# Patient Record
Sex: Female | Born: 1990 | Race: Black or African American | Hispanic: No | Marital: Single | Smoking: Never smoker
Health system: Southern US, Community
[De-identification: ages and names within clinical notes are randomized; demographics above are authoritative.]

## PROBLEM LIST (undated history)

## (undated) ENCOUNTER — Inpatient Hospital Stay (HOSPITAL_COMMUNITY): Payer: Self-pay

## (undated) DIAGNOSIS — Z9289 Personal history of other medical treatment: Secondary | ICD-10-CM

## (undated) DIAGNOSIS — F32A Depression, unspecified: Secondary | ICD-10-CM

## (undated) DIAGNOSIS — K602 Anal fissure, unspecified: Secondary | ICD-10-CM

## (undated) DIAGNOSIS — R87629 Unspecified abnormal cytological findings in specimens from vagina: Secondary | ICD-10-CM

## (undated) DIAGNOSIS — F419 Anxiety disorder, unspecified: Secondary | ICD-10-CM

## (undated) DIAGNOSIS — T7840XA Allergy, unspecified, initial encounter: Secondary | ICD-10-CM

## (undated) DIAGNOSIS — Z8619 Personal history of other infectious and parasitic diseases: Secondary | ICD-10-CM

## (undated) HISTORY — DX: Personal history of other medical treatment: Z92.89

## (undated) HISTORY — PX: MOUTH SURGERY: SHX715

## (undated) HISTORY — DX: Personal history of other infectious and parasitic diseases: Z86.19

## (undated) HISTORY — DX: Anal fissure, unspecified: K60.2

## (undated) HISTORY — PX: BILATERAL SALPINGECTOMY: SHX5743

## (undated) HISTORY — DX: Depression, unspecified: F32.A

## (undated) HISTORY — DX: Anxiety disorder, unspecified: F41.9

## (undated) HISTORY — PX: COSMETIC SURGERY: SHX468

## (undated) HISTORY — DX: Allergy, unspecified, initial encounter: T78.40XA

---
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
## 2010-02-08 ENCOUNTER — Inpatient Hospital Stay (HOSPITAL_COMMUNITY): Admission: AD | Admit: 2010-02-08 | Discharge: 2010-02-08 | Payer: Self-pay | Admitting: Obstetrics and Gynecology

## 2010-04-06 ENCOUNTER — Inpatient Hospital Stay (HOSPITAL_COMMUNITY): Admission: AD | Admit: 2010-04-06 | Discharge: 2010-04-09 | Payer: Self-pay | Admitting: Obstetrics and Gynecology

## 2010-04-07 ENCOUNTER — Encounter (INDEPENDENT_AMBULATORY_CARE_PROVIDER_SITE_OTHER): Payer: Self-pay | Admitting: Obstetrics and Gynecology

## 2010-09-08 ENCOUNTER — Emergency Department (HOSPITAL_COMMUNITY)
Admission: EM | Admit: 2010-09-08 | Discharge: 2010-09-08 | Payer: Self-pay | Source: Home / Self Care | Admitting: Emergency Medicine

## 2010-09-18 ENCOUNTER — Encounter (INDEPENDENT_AMBULATORY_CARE_PROVIDER_SITE_OTHER): Payer: Self-pay | Admitting: *Deleted

## 2010-09-25 NOTE — Letter (Signed)
Summary: New Patient letter  [facility]  [address]   [place], [place] [address]   Phone: [phone]  Fax: [phone]       09/18/2010 MRN: [id number]  [facility] [address] [place], [place]  [address]  Dear Ms. Osoria,  Welcome to the Gastroenterology Division at [facility].    You are scheduled to see Dr.  Arlyce Dice on 10/31/2010 at 2:15 on the 3rd floor at [facility], [address]  We ask that you try to arrive at our office 15 minutes prior to your appointment time to allow for check-in.  We would like you to complete the enclosed self-administered evaluation form prior to your visit and bring it with you on the day of your appointment.  We will review it with you.  Also, please bring a complete list of all your medications or, if you prefer, bring the medication bottles and we will list them.  Please bring your insurance card so that we may make a copy of it.  If your insurance requires a referral to see a specialist, please bring your referral form from your primary care physician.  Co-payments are due at the time of your visit and may be paid by cash, check or credit card.     Your office visit will consist of a consult with your physician (includes a physical exam), any laboratory testing he/she may order, scheduling of any necessary diagnostic testing (e.g. x-ray, ultrasound, CT-scan), and scheduling of a procedure (e.g. Endoscopy, Colonoscopy) if required.  Please allow enough time on your schedule to allow for any/all of these possibilities.    If you cannot keep your appointment, please call [phone] to cancel or reschedule prior to your appointment date.  This allows [place] the opportunity to schedule an appointment for another patient in need of care.  If you do not cancel or reschedule by 5 p.m. the business day prior to your appointment date, you will be charged a $50.00 late cancellation/no-show fee.    Thank you for choosing  [facility] for your medical needs.  We appreciate the opportunity to care for you.  Please visit [place] at our website  to learn more about our practice.                     Sincerely,                                                             The Gastroenterology Division

## 2010-10-24 LAB — CBC
HCT: 33.4 % — ABNORMAL LOW (ref 36.0–46.0)
HCT: 36 % (ref 36.0–46.0)
Hemoglobin: 11.3 g/dL — ABNORMAL LOW (ref 12.0–15.0)
Hemoglobin: 12.3 g/dL (ref 12.0–15.0)
MCH: 31.6 pg (ref 26.0–34.0)
MCH: 31.8 pg (ref 26.0–34.0)
MCHC: 33.9 g/dL (ref 30.0–36.0)
MCHC: 34.2 g/dL (ref 30.0–36.0)
MCV: 92.5 fL (ref 78.0–100.0)
MCV: 93.7 fL (ref 78.0–100.0)
Platelets: 171 10*3/uL (ref 150–400)
Platelets: 207 10*3/uL (ref 150–400)
RBC: 3.56 MIL/uL — ABNORMAL LOW (ref 3.87–5.11)
RBC: 3.89 MIL/uL (ref 3.87–5.11)
RDW: 13.2 % (ref 11.5–15.5)
RDW: 13.3 % (ref 11.5–15.5)
WBC: 8.4 10*3/uL (ref 4.0–10.5)
WBC: 9.4 10*3/uL (ref 4.0–10.5)

## 2010-10-24 LAB — RPR: RPR Ser Ql: NONREACTIVE

## 2010-10-26 LAB — CBC
MCH: 31.3 pg (ref 26.0–34.0)
MCHC: 34 g/dL (ref 30.0–36.0)
Platelets: 209 10*3/uL (ref 150–400)
RBC: 3.47 MIL/uL — ABNORMAL LOW (ref 3.87–5.11)

## 2010-10-26 LAB — COMPREHENSIVE METABOLIC PANEL
ALT: 11 U/L (ref 0–35)
AST: 16 U/L (ref 0–37)
Albumin: 2.8 g/dL — ABNORMAL LOW (ref 3.5–5.2)
Calcium: 9 mg/dL (ref 8.4–10.5)
Creatinine, Ser: 0.43 mg/dL (ref 0.4–1.2)
GFR calc Af Amer: >60 mL/min (ref >60)
Sodium: 135 mEq/L (ref 135–145)

## 2010-10-26 LAB — URINALYSIS, ROUTINE W REFLEX MICROSCOPIC
Bilirubin Urine: NEGATIVE
Glucose, UA: NEGATIVE mg/dL
Hgb urine dipstick: NEGATIVE
Specific Gravity, Urine: 1.02 (ref 1.005–1.030)
pH: 6 (ref 5.0–8.0)

## 2010-10-26 LAB — URINE MICROSCOPIC-ADD ON

## 2010-10-31 ENCOUNTER — Ambulatory Visit (INDEPENDENT_AMBULATORY_CARE_PROVIDER_SITE_OTHER): Payer: BC Managed Care – PPO | Admitting: Gastroenterology

## 2010-10-31 ENCOUNTER — Encounter: Payer: Self-pay | Admitting: Gastroenterology

## 2010-10-31 VITALS — BP 112/64 | HR 88 | Ht 66.0 in

## 2010-10-31 DIAGNOSIS — K602 Anal fissure, unspecified: Secondary | ICD-10-CM | POA: Insufficient documentation

## 2010-10-31 MED ORDER — HYDROCORTISONE ACETATE 25 MG RE SUPP: 25.0000 mg | Freq: Every day | RECTAL | Status: AC

## 2010-10-31 NOTE — Patient Instructions (Signed)
Please schedule a 6 week follow up  Anal Fissure An anal fissure often begins with sharp pain. This is usually following a bowel movement. It often causes bright red blood stained stools. It is the most common cause of rectal bleeding. One common cause of this is passage of a large, hard stool. It can also be caused by having frequent diarrheal stools. Anal fissures that occur for a longtime (chronic) may require surgery. CAUSES  Passing large, hard stools.   Frequent diarrheal stools.   Constipation.  SYMPTOMS  Bright red, blood stained stools.   Rectal bleeding.  HOME CARE INSTRUCTIONS  If constipation is the cause of the rectal fissure, it may be necessary to add a bulk-forming laxative. A diet high in fruits, whole grains, and vegetables will also help.   Taking hot sitz baths for 1 half hour 4 times per day may help.   Increase your fluid intake.   Only take over-the-counter or prescription medicines for pain, discomfort, or fever as directed by your caregiver. Do not take aspirin as this may increase the tendency for bleeding.   Do not use ointments containing anesthetic medications or hydrocortisone. They could slow healing.   Avoid constipating foods such as bananas and cheese.   In children, brown Karo syrup may be used by adding 1 teaspoon of syrup to 8 ounces of formula. An alternative is to give 3 teaspoons of mineral oil every day.  SEEK MEDICAL CARE IF: Rectal bleeding continues, changes in intensity, or becomes more severe. MAKE SURE YOU:   Understand these instructions.   Will watch your condition.   Will get help right away if you are not doing well or get worse.  Document Released: 07/27/2005 Document Re-Released: 10/21/2009 [facility] Patient Information 2011 [place], [place].  Hemorrhoids Hemorrhoids are dilated (enlarged) veins around the rectum. Sometimes clots will form in the veins. This makes them swollen and painful. These are called thrombosed  hemorrhoids. Causes of hemorrhoids include:  Pregnancy: this increases the pressure in the hemorrhoidal veins.   Constipation.   Straining to have a bowel movement.  HOME CARE INSTRUCTIONS  Eat a well balanced diet and drink 6 to 8 glasses of water every day to avoid constipation. You may also use a bulk laxative.   Avoid straining to have bowel movements.   Keep anal area dry and clean.   Only take over-the-counter or prescription medicines for pain, discomfort, or fever as directed by your caregiver.  If thrombosed:  Take hot sitz baths for 20 to 30 minutes, 3 to 4 times per day.   If the hemorrhoids are very tender and swollen, place ice packs on area as tolerated. Using ice packs between sitz baths may be helpful. Fill a plastic bag with ice and use a towel between the bag of ice and your skin.   Special creams and suppositories (Anusol, Nupercainal, Wyanoids) may be used or applied as directed.   Do not use a donut shaped pillow or sit on the toilet for long periods. This increases blood pooling and pain.   Move your bowels when your body has the urge; this will require less straining and will decrease pain and pressure.   Only take over-the-counter or prescription medicines for pain, discomfort, or fever as directed by your caregiver.  SEEK MEDICAL CARE IF:  You have increasing pain and swelling that is not controlled with your prescription.   You have uncontrolled bleeding.   You have an inability or difficulty having a bowel  movement.   You have pain or inflammation outside the area of the hemorrhoids.  MAKE SURE YOU:   Understand these instructions.   Will watch your condition.   Will get help right away if you are not doing well or get worse.  Document Released: 07/24/2000 Document Re-Released: 07/09/2008 [facility] Patient Information 2011 [place], [place].

## 2010-10-31 NOTE — Assessment & Plan Note (Signed)
The patient has a anal fissure causing symptoms of pain.  Recommendations #1 stool softeners. #2 warm soaks. #3 Anusol HC suppositories.  I instructed the patient to contact me in 2-3 weeks. If symptoms are not improved at which point I would consider topical therapy

## 2010-10-31 NOTE — Progress Notes (Signed)
History of Present Illness:  Leslie Ayers  Is a pleasant, 20 year old Afro-American female, referred at the request of Dr. Alfonse Ras  For evaluation of rectal pain. In December, 2011 she developed constipation with straining. At one point , with straining, she felt a tearing sensation of per rectum. Since that time she's having rectal pain with bowel movements. She is without bleeding. Bowels are now moving more regularly, but pain persists. She was seen at an urgent care and told to have an anal fissure.    Review of Systems: Pertinent positive and negative review of systems were noted in the above HPI section. All other review of systems were otherwise negative.    Current Medications, Allergies, Past Medical History, Past Surgical History, Family History and Social History were reviewed in [employer] [facility] electronic medical record  Vital signs were reviewed in today's medical record. Physical Exam: General: Well developed , well nourished, no acute distress Head: Normocephalic and atraumatic Eyes:  sclerae anicteric, EOMI Ears: Normal auditory acuity Mouth: No deformity or lesions Lungs: Clear throughout to auscultation Heart: Regular rate and rhythm; no murmurs, rubs or bruits Abdomen: Soft, non tender and non distended. No masses, hepatosplenomegaly or hernias noted. Normal Bowel sounds Rectal:   Inspection of the rectum demonstrated a fissure at the 7:00 position Musculoskeletal: Symmetrical with no gross deformities  Pulses:  Normal pulses noted Extremities: No clubbing, cyanosis, edema or deformities noted Neurological: Alert oriented x 4, grossly nonfocal Psychological:  Alert and cooperative. Normal mood and affect

## 2010-12-23 NOTE — Op Note (Signed)
Leslie Ayers, Leslie Ayers                ACCOUNT NO.:  [id number]   MEDICAL RECORD NO.:  [id number]          PATIENT TYPE:  AMB   LOCATION:  SDC                           FACILITY:  [facility]   PHYSICIAN:  Maxie Better, M.D.DATE OF BIRTH:  1990-10-19   DATE OF PROCEDURE:  11/30/2007  DATE OF DISCHARGE:                               OPERATIVE REPORT   PREOPERATIVE DIAGNOSIS:  Elective termination, intrauterine gestation  about 8 plus weeks.   PROCEDURE:  Ultrasound guided suction dilation and evacuation.   POSTOPERATIVE DIAGNOSIS:  Elective termination.   ANESTHESIA:  MAC and paracervical block.   SURGEON:  Maxie Better, M.D.   INDICATIONS:  20 year old gravida 1, para 0 female at about [redacted] weeks  gestation by LMP presents for elective termination.  Surgical risks were  reviewed with the patient and her mother.  Consent was signed and the  patient was transferred to the operating room.   PROCEDURE IN DETAIL:  Under adequate monitored anesthesia, the patient  was placed in the dorsal lithotomy position.  She was sterilely prepped  and draped in the usual fashion.  The bladder was catheterized for a  large amount of urine.  Examination under anesthesia revealed an 8-9  weeks size anteverted uterus.  No adnexal masses could be appreciated.  A bivalve speculum was placed in the vagina.  A single tooth tenaculum  was placed on the anterior lip of the cervix.  20 mL of 1% Nesacaine was  injected paracervically at 3 and 9 o'clock, the cervix serially dilated  up to #31 Pratt dilator and #7 mm curved suction cannula was then  introduced into the uterine cavity. Under ultrasound guidance, the  uterine cavity was evacuated of products of conception.  The cavity was  subsequently curetted.  A thin endometrial stripe was then noted by  ultrasound and the procedure was felt to be complete at which time all  instruments were then removed from the vagina.  The specimen labeled  products of  conception was sent to pathology.  Estimated blood loss was  minimal.  Complications none.  The patient tolerated the procedure well  and was transferred to the recovery room in stable condition.  Her blood  type is AB positive.      Maxie Better, M.D.  Electronically Signed     [place]/MEDQ  D:  11/30/2007  T:  11/30/2007  Job:  [id number]

## 2011-04-28 ENCOUNTER — Telehealth: Payer: Self-pay | Admitting: Gastroenterology

## 2011-04-28 NOTE — Telephone Encounter (Signed)
Pt states she is still having problems going to the bathroom. States she only goes about 1/week and they are very large stools. She is also still having problems with the fissure. Requesting to be seen. Pt scheduled to see Dr. Arlyce Dice 04/29/11@3 :45pm. Pt aware of appt date and time.

## 2011-04-29 ENCOUNTER — Ambulatory Visit (INDEPENDENT_AMBULATORY_CARE_PROVIDER_SITE_OTHER): Payer: BC Managed Care – PPO | Admitting: Gastroenterology

## 2011-04-29 ENCOUNTER — Encounter: Payer: Self-pay | Admitting: Gastroenterology

## 2011-04-29 DIAGNOSIS — K5900 Constipation, unspecified: Secondary | ICD-10-CM | POA: Insufficient documentation

## 2011-04-29 DIAGNOSIS — K602 Anal fissure, unspecified: Secondary | ICD-10-CM

## 2011-04-29 NOTE — Progress Notes (Signed)
Leslie Ayers has returned for evaluation of constipation and rectal pain. She moves her bowels only once a week. She has severe straining and inability to evacuate her stools. Despite taking suppositories she continues to have rectal discomfort and minimal amounts of bleeding.

## 2011-04-29 NOTE — Assessment & Plan Note (Addendum)
She continues symptomatic but I doubt any therapy will be successful as long as she is having such severe constipation. I will treat  her fissure once her constipation is under control. In the interim she'll continue warm soaks and suppositories

## 2011-04-29 NOTE — Assessment & Plan Note (Addendum)
Patient likely has idiopathic constipation. She'll consider enrollment in a constipation trial. Failing this, I will place  her on MiraLax and fiber supplements

## 2011-04-29 NOTE — Patient Instructions (Signed)
Research will speak with you today Call back as needed

## 2011-05-05 LAB — CBC
MCHC: 34.2
RBC: 4.26
WBC: 8.1

## 2011-05-05 LAB — ABO/RH: ABO/RH(D): AB POS

## 2014-01-12 ENCOUNTER — Inpatient Hospital Stay (HOSPITAL_COMMUNITY)
Admission: AD | Admit: 2014-01-12 | Discharge: 2014-01-12 | Disposition: A | Discharge status: Home / Self Care | Payer: BC Managed Care – PPO | Source: Ambulatory Visit | Attending: Obstetrics and Gynecology | Admitting: Obstetrics and Gynecology

## 2014-01-12 ENCOUNTER — Other Ambulatory Visit (HOSPITAL_COMMUNITY): Payer: Self-pay | Admitting: *Deleted

## 2014-01-12 ENCOUNTER — Inpatient Hospital Stay (HOSPITAL_COMMUNITY): Payer: BC Managed Care – PPO

## 2014-01-12 ENCOUNTER — Encounter (HOSPITAL_COMMUNITY): Payer: Self-pay | Admitting: *Deleted

## 2014-01-12 DIAGNOSIS — Z833 Family history of diabetes mellitus: Secondary | ICD-10-CM | POA: Insufficient documentation

## 2014-01-12 DIAGNOSIS — R109 Unspecified abdominal pain: Secondary | ICD-10-CM | POA: Insufficient documentation

## 2014-01-12 DIAGNOSIS — O99891 Other specified diseases and conditions complicating pregnancy: Secondary | ICD-10-CM | POA: Insufficient documentation

## 2014-01-12 DIAGNOSIS — O26899 Other specified pregnancy related conditions, unspecified trimester: Secondary | ICD-10-CM

## 2014-01-12 DIAGNOSIS — O9989 Other specified diseases and conditions complicating pregnancy, childbirth and the puerperium: Principal | ICD-10-CM

## 2014-01-12 LAB — URINALYSIS, ROUTINE W REFLEX MICROSCOPIC
BILIRUBIN URINE: NEGATIVE
GLUCOSE, UA: NEGATIVE mg/dL
HGB URINE DIPSTICK: NEGATIVE
Ketones, ur: 15 mg/dL — AB
Nitrite: NEGATIVE
PROTEIN: NEGATIVE mg/dL
Specific Gravity, Urine: 1.015 (ref 1.005–1.030)
UROBILINOGEN UA: 0.2 mg/dL (ref 0.0–1.0)
pH: 6 (ref 5.0–8.0)

## 2014-01-12 LAB — WET PREP, GENITAL
Trich, Wet Prep: NONE SEEN
YEAST WET PREP: NONE SEEN

## 2014-01-12 LAB — CBC WITH DIFFERENTIAL/PLATELET
BASOS ABS: 0.1 10*3/uL (ref 0.0–0.1)
BASOS PCT: 1 % (ref 0–1)
EOS ABS: 0.1 10*3/uL (ref 0.0–0.7)
EOS PCT: 1 % (ref 0–5)
HEMATOCRIT: 41.9 % (ref 36.0–46.0)
HEMOGLOBIN: 14 g/dL (ref 12.0–15.0)
Lymphocytes Relative: 37 % (ref 12–46)
Lymphs Abs: 2.3 10*3/uL (ref 0.7–4.0)
MCH: 29.7 pg (ref 26.0–34.0)
MCHC: 33.4 g/dL (ref 30.0–36.0)
MCV: 88.8 fL (ref 78.0–100.0)
MONO ABS: 0.4 10*3/uL (ref 0.1–1.0)
MONOS PCT: 6 % (ref 3–12)
NEUTROS ABS: 3.5 10*3/uL (ref 1.7–7.7)
Neutrophils Relative %: 55 % (ref 43–77)
Platelets: 225 10*3/uL (ref 150–400)
RBC: 4.72 MIL/uL (ref 3.87–5.11)
RDW: 12 % (ref 11.5–15.5)
WBC: 6.3 10*3/uL (ref 4.0–10.5)

## 2014-01-12 LAB — HCG, QUANTITATIVE, PREGNANCY: hCG, Beta Chain, Quant, S: 280 m[IU]/mL — ABNORMAL HIGH (ref <5)

## 2014-01-12 LAB — URINE MICROSCOPIC-ADD ON

## 2014-01-12 LAB — POCT PREGNANCY, URINE: PREG TEST UR: POSITIVE — AB

## 2014-01-12 NOTE — MAU Note (Signed)
Patient states she has had a positive home pregnancy test. Has had low back and low abdominal pain off and on for one week. Denies bleeding, nausea or vomiting but has a slight vaginal discharge.

## 2014-01-12 NOTE — MAU Provider Note (Signed)
CSN: [id number]     Arrival date & time 01/12/14  1454 History   None    Chief Complaint  Patient presents with  . Possible Pregnancy  . Back Pain  . Abdominal Pain     (Consider location/radiation/quality/duration/timing/severity/associated sxs/prior Treatment) Patient is a 23 y.o. female presenting with cramps. The history is provided by the patient.  Abdominal Cramping The primary symptoms of the illness include vaginal discharge. The primary symptoms of the illness do not include fever, nausea, vomiting, diarrhea, dysuria or vaginal bleeding. Abdominal pain: cramping. The onset of the illness was gradual.  The vaginal discharge was first noticed 2 days ago. Vaginal discharge is a new problem. The patient believes that the vaginal discharge is unchanged since it began. The amount of discharge is moderate. The color of the discharge is clear. The vaginal discharge is odorless. The vaginal discharge is not associated with dysuria.   The patient states that she believes she is currently pregnant. Symptoms associated with the illness do not include back pain.   Leslie Ayers is a 23 y.o. G2P1001 who presents to the MAU with abdominal cramping that started a few days ago. She describes the symptoms as feeling like she does when she is ready to start her period. The cramping goes across the lower abdomen. She had a positive HPT yesterday.    Past Medical History  Diagnosis Date  . Anal fissure   . Constipation   . Palpitation    Past Surgical History  Procedure Laterality Date  . 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 No past surgeries     Family History  Problem Relation Age of Onset  . Diabetes Father   . Irritable bowel syndrome Mother   . Colon cancer Neg Hx    History  Substance Use Topics  . Smoking status: Never Smoker   . Smokeless tobacco: Never Used  . Alcohol Use: No   OB History   Grav Para Term Preterm Abortions TAB SAB Ect Mult Living   2 1 1        1      Review of Systems  Constitutional: Negative for fever.  HENT: Negative.   Eyes: Negative for pain and redness.  Respiratory: Negative for cough and wheezing.   Cardiovascular: Negative for chest pain.  Gastrointestinal: Negative for nausea, vomiting and diarrhea. Abdominal pain: cramping.  Genitourinary: Positive for vaginal discharge. Negative for dysuria and vaginal bleeding.  Musculoskeletal: Negative for back pain.  Skin: Negative for rash.  Neurological: Negative for syncope and headaches.  Psychiatric/Behavioral: Negative for confusion. The patient is not nervous/anxious.       Allergies  Penicillins  Home Medications   Prior to Admission medications   Not on File   BP 122/74  Pulse 94  Temp(Src) 98.9 F (37.2 C) (Oral)  Resp 16  Ht 5' 5.5" (1.664 m)  Wt 175 lb (79.379 kg)  BMI 28.67 kg/m2  SpO2 100%  LMP 12/07/2013 Physical Exam  Nursing note and vitals reviewed. Constitutional: She is oriented to person, place, and time. She appears well-developed and well-nourished. No distress.  HENT:  Head: Normocephalic.  Eyes: Conjunctivae and EOM are normal.  Neck: Neck supple.  Cardiovascular: Normal rate.   Pulmonary/Chest: Effort normal.  Abdominal: Soft. Bowel sounds are normal. There is no tenderness.  Unable to reproduce the cramping pain the patient has had.  Genitourinary:  External genitalia without lesions. White discharge vaginal vault.  Cervix long, closed, no CMT, no adnexal tenderness. Uterus without palpable enlargement.   Musculoskeletal: Normal range of motion.  Neurological: She is alert and oriented to person, place, and time. No cranial nerve deficit.  Skin: Skin is warm and dry.  Psychiatric: She has a normal mood and affect. Her behavior is normal.    ED Course  Procedures Results for orders placed during the hospital encounter of 01/12/14 (from the past 24 hour(s))  URINALYSIS, ROUTINE W REFLEX MICROSCOPIC     Status: Abnormal    Collection Time    01/12/14  4:00 PM      Result Value Ref Range   Color, Urine YELLOW  YELLOW   APPearance CLEAR  CLEAR   Specific Gravity, Urine 1.015  1.005 - 1.030   pH 6.0  5.0 - 8.0   Glucose, UA NEGATIVE  NEGATIVE mg/dL   Hgb urine dipstick NEGATIVE  NEGATIVE   Bilirubin Urine NEGATIVE  NEGATIVE   Ketones, ur 15 (*) NEGATIVE mg/dL   Protein, ur NEGATIVE  NEGATIVE mg/dL   Urobilinogen, UA 0.2  0.0 - 1.0 mg/dL   Nitrite NEGATIVE  NEGATIVE   Leukocytes, UA MODERATE (*) NEGATIVE  URINE MICROSCOPIC-ADD ON     Status: Abnormal   Collection Time    01/12/14  4:00 PM      Result Value Ref Range   Squamous Epithelial / LPF FEW (*) RARE   WBC, UA 3-6  <3 WBC/hpf  POCT PREGNANCY, URINE     Status: Abnormal   Collection Time    01/12/14  4:26 PM      Result Value Ref Range   Preg Test, Ur POSITIVE (*) NEGATIVE  WET PREP, GENITAL     Status: Abnormal   Collection Time    01/12/14  5:33 PM      Result Value Ref Range   Yeast Wet Prep HPF POC NONE SEEN  NONE SEEN   Trich, Wet Prep NONE SEEN  NONE SEEN   Clue Cells Wet Prep HPF POC MODERATE (*) NONE SEEN   WBC, Wet Prep HPF POC MODERATE (*) NONE SEEN  CBC WITH DIFFERENTIAL     Status: None   Collection Time    01/12/14  5:45 PM      Result Value Ref Range   WBC 6.3  4.0 - 10.5 K/uL   RBC 4.72  3.87 - 5.11 MIL/uL   Hemoglobin 14.0  12.0 - 15.0 g/dL   HCT 16.141.9  09.636.0 - 04.546.0 %   MCV 88.8  78.0 - 100.0 fL   MCH 29.7  26.0 - 34.0 pg   MCHC 33.4  30.0 - 36.0 g/dL   RDW 40.912.0  81.111.5 - 91.415.5 %   Platelets 225  150 - 400 K/uL   Neutrophils Relative % 55  43 - 77 %   Neutro Abs 3.5  1.7 - 7.7 K/uL   Lymphocytes Relative 37  12 - 46 %   Lymphs Abs 2.3  0.7 - 4.0 K/uL   Monocytes Relative 6  3 - 12 %   Monocytes Absolute 0.4  0.1 - 1.0 K/uL   Eosinophils Relative 1  0 - 5 %   Eosinophils Absolute 0.1  0.0 - 0.7 K/uL   Basophils Relative 1  0 - 1 %   Basophils Absolute 0.1  0.0 - 0.1 K/uL  HCG, QUANTITATIVE, PREGNANCY     Status:  Abnormal   Collection Time    01/12/14  5:45 PM  Result Value Ref Range   hCG, Beta Chain, Quant, S 280 (*) <5 mIU/mL    US Ob Comp Less 14 Wks  01/12/2014   CLINICAL DATA:  Pelvic pain.  Early pregnancy.  EXAM: OBSTETRIC <14 WK [place] AND TRANSVAGINAL OB US  TECHNIQUE: Both transabdominal and transvaginal ultrasound examinations were performed for complete evaluation of the gestation as well as the maternal uterus, adnexal regions, and pelvic cul-de-sac. Transvaginal technique was performed to assess early pregnancy.  COMPARISON:  No priors.  FINDINGS: Intrauterine gestational sac: None.  Yolk sac:  None.  Embryo:  None.  Cardiac Activity: None.  Maternal uterus/adnexae: Left ovary is normal in appearance. Right ovary is also normal in appearance, with a dominant follicle incidentally noted. Trace volume of free fluid in the cul-de-sac, presumably physiologic in this young female patient.  IMPRESSION: 1. No IUP identified. 2. Trace volume of free fluid in the cul-de-sac, presumably physiologic in this young female patient.   Electronically Signed   By: Trudie Reed M.D.   On: 01/12/2014 19:01   US Ob Transvaginal  01/12/2014   CLINICAL DATA:  Pelvic pain.  Early pregnancy.  EXAM: OBSTETRIC <14 WK [place] AND TRANSVAGINAL OB US  TECHNIQUE: Both transabdominal and transvaginal ultrasound examinations were performed for complete evaluation of the gestation as well as the maternal uterus, adnexal regions, and pelvic cul-de-sac. Transvaginal technique was performed to assess early pregnancy.  COMPARISON:  No priors.  FINDINGS: Intrauterine gestational sac: None.  Yolk sac:  None.  Embryo:  None.  Cardiac Activity: None.  Maternal uterus/adnexae: Left ovary is normal in appearance. Right ovary is also normal in appearance, with a dominant follicle incidentally noted. Trace volume of free fluid in the cul-de-sac, presumably physiologic in this young female patient.  IMPRESSION: 1. No IUP identified. 2. Trace volume  of free fluid in the cul-de-sac, presumably physiologic in this young female patient.   Electronically Signed   By: Trudie Reed M.D.   On: 01/12/2014 19:01   MDM  23 y.o. female with lower abdominal cramping and positive HPT. Stable for discharge without pain or bleeding at this time. Discussed with the patient need for follow up Bhcg in 2 days. Ectopic precautions. I have reviewed this patient's vital signs, nurses notes, appropriate labs and imaging.  I have discussed findings with the patient and plan of care and she voices understanding and agrees to plan.

## 2014-01-12 NOTE — Discharge Instructions (Signed)
Your pregnancy hormone level today is to low to be able to see a pregnancy on ultrasound. We will need for you to return in 2 days to repeat the blood work to see if your hormone level is rising at a normal rate. Return sooner for ay problems. No sex, no tampons, nothing in the vagina until follow up.   Abdominal Pain During Pregnancy Belly (abdominal) pain is common during pregnancy. Most of the time, it is not a serious problem. Other times, it can be a sign that something is wrong with the pregnancy. Always tell your doctor if you have belly pain. HOME CARE Monitor your belly pain for any changes. The following actions may help you feel better:  Do not have sex (intercourse) or put anything in your vagina until you feel better.  Rest until your pain stops.  Drink clear fluids if you feel sick to your stomach (nauseous). Do not eat solid food until you feel better.  Only take medicine as told by your doctor.  Keep all doctor visits as told. GET HELP RIGHT AWAY IF:   You are bleeding, leaking fluid, or pieces of tissue come out of your vagina.  You have more pain or cramping.  You keep throwing up (vomiting).  You have pain when you pee (urinate) or have blood in your pee.  You have a fever.  You do not feel your baby moving as much.  You feel very weak or feel like passing out.  You have trouble breathing, with or without belly pain.  You have a very bad headache and belly pain.  You have fluid leaking from your vagina and belly pain.  You keep having watery poop (diarrhea).  Your belly pain does not go away after resting, or the pain gets worse. MAKE SURE YOU:   Understand these instructions.  Will watch your condition.  Will get help right away if you are not doing well or get worse. Document Released: 07/15/2009 Document Revised: 03/29/2013 Document Reviewed: 02/23/2013 [facility] Patient Information 2014 [place], [place].

## 2014-01-13 LAB — GC/CHLAMYDIA PROBE AMP
CT PROBE, AMP APTIMA: POSITIVE — AB
GC PROBE AMP APTIMA: NEGATIVE

## 2014-01-13 NOTE — MAU Provider Note (Signed)
Attestation of Attending Supervision of Advanced Practitioner: Evaluation and management procedures were performed by the PA/NP/CNM/OB Fellow under my supervision/collaboration. Chart reviewed and agree with management and plan.  Christin Bach V 01/13/2014 2:40 PM

## 2014-01-14 ENCOUNTER — Inpatient Hospital Stay (HOSPITAL_COMMUNITY)
Admission: AD | Admit: 2014-01-14 | Discharge: 2014-01-14 | Disposition: A | Discharge status: Home / Self Care | Payer: BC Managed Care – PPO | Source: Ambulatory Visit | Attending: Obstetrics | Admitting: Obstetrics

## 2014-01-14 DIAGNOSIS — R109 Unspecified abdominal pain: Secondary | ICD-10-CM | POA: Insufficient documentation

## 2014-01-14 DIAGNOSIS — Z3490 Encounter for supervision of normal pregnancy, unspecified, unspecified trimester: Secondary | ICD-10-CM

## 2014-01-14 DIAGNOSIS — A749 Chlamydial infection, unspecified: Secondary | ICD-10-CM

## 2014-01-14 DIAGNOSIS — N760 Acute vaginitis: Secondary | ICD-10-CM | POA: Insufficient documentation

## 2014-01-14 DIAGNOSIS — A5619 Other chlamydial genitourinary infection: Secondary | ICD-10-CM | POA: Insufficient documentation

## 2014-01-14 DIAGNOSIS — B9689 Other specified bacterial agents as the cause of diseases classified elsewhere: Secondary | ICD-10-CM

## 2014-01-14 DIAGNOSIS — O2390 Unspecified genitourinary tract infection in pregnancy, unspecified trimester: Secondary | ICD-10-CM | POA: Insufficient documentation

## 2014-01-14 DIAGNOSIS — N739 Female pelvic inflammatory disease, unspecified: Secondary | ICD-10-CM | POA: Insufficient documentation

## 2014-01-14 LAB — URINE CULTURE
Colony Count: NO GROWTH
Culture: NO GROWTH

## 2014-01-14 LAB — HCG, QUANTITATIVE, PREGNANCY: HCG, BETA CHAIN, QUANT, S: 540 m[IU]/mL — AB (ref <5)

## 2014-01-14 MED ORDER — PRENATAL VITAMINS PLUS 27-1 MG PO TABS: 1.0000 | ORAL_TABLET | Freq: Every day | ORAL | Status: DC

## 2014-01-14 MED ORDER — PROMETHAZINE HCL 12.5 MG PO TABS: 12.5000 mg | ORAL_TABLET | Freq: Four times a day (QID) | ORAL | Status: DC | PRN

## 2014-01-14 MED ORDER — AZITHROMYCIN 500 MG PO TABS: 1000.0000 mg | ORAL_TABLET | Freq: Once | ORAL | Status: AC

## 2014-01-14 MED ORDER — METRONIDAZOLE 500 MG PO TABS: 500.0000 mg | ORAL_TABLET | Freq: Two times a day (BID) | ORAL | Status: AC

## 2014-01-14 NOTE — Progress Notes (Addendum)
Established patient of [facility] - seen by [facility] on Friday June 5th and evaluated - scheduled for quant follow-up today. [facility] not contacted to evaluate patient on Friday or called for management plan.  Seen for vaginal discharge - abdominal pain with cramping with (+) UPT at home.  Here today for repeat quant per order of Faculty [facility] provider.   Per chart - quant 280 and sono done without any significant findings due to low gestational age. Urinalysis: 15 ketones spec gravity 1015 Wet prep: moderate CLUE and moderate WBC  (Not treated for BV at visit 6/5) Gonorrhea: negative Chlamydia: POSITIVE  (no presumptive treatment at time of service 6/5) urine culture - negative   TODAY - quant 540 with good rise.  No bleeding or spotting. Persistent cramping but no intense abdominal pain. Patient notified of positive BV and chlamydia - RX to her local pharmacy to complete as prescribed. Call [facility] tomorrow for apt in 1 week. Reminded to call [facility] PRIOR to hospital visit.  RX to pharmacy Promethazine 12.5 to take prior to Azithromycin doses today to prevent vomiting. Azithromycin 1000mg  single dose Metronidiazole 500 BID x 7 days Prenatal vitamin daily  NO sex until BOTH partners complete treatment and she has TOC USE condoms  Call if any bleeding or worsening abdominal pain.  Marlinda Mike CNM MSN [facility]

## 2014-01-14 NOTE — Discharge Instructions (Signed)
Bacterial Vaginosis Bacterial vaginosis is an infection of the vagina. It happens when too many of certain germs (bacteria) grow in the vagina. HOME CARE  Take your medicine as told by your doctor.  Finish your medicine even if you start to feel better.  Do not have sex until you finish your medicine and are better.  Tell your sex partner that you have an infection. They should see their doctor for treatment.  Practice safe sex. Use condoms. Have only one sex partner. GET HELP IF:  You are not getting better after 3 days of treatment.  You have more pain than before.  You have a fever. MAKE SURE YOU:   Understand these instructions.  Will watch your condition.  Will get help right away if you are not doing well or get worse.   Document Released: 05/05/2008 Document Revised: 05/17/2013 Document Reviewed: 03/08/2013 [facility] Patient Information 2014 [place], [place].  Chlamydia, Female Chlamydia is an infection. It is spread from one person to another person during sexual contact. This infection can be in the cervix, urine tube (urethra), throat, or bottom (rectum). This infection needs treatment. HOME CARE   Take your medicines (antibiotics) as told. Finish them even if you start to feel better.  Only take medicine as told by your doctor.  Tell your sex partner(s) that you have chlamydia. They must also be treated immediately.  Do not have sex until your doctor says it is okay.  Rest.  Eat healthy.   Drink enough fluids to keep your pee (urine) clear or pale yellow. GET HELP IF:  You have pain when you pee.  You have abdominal pain.  You have vaginal discharge. GET HELP RIGHT AWAY IF:   You have a fever that will not go away after 3 days.  You have a fever and your symptoms suddenly get worse.  You feel sick to your stomach (nauseous) or you throw up (vomit).  You have trouble swallowing. MAKE SURE YOU:   Understand these instructions.  Will watch  your condition.  Will get help right away if you are not doing well or get worse. Document Released: 05/05/2008 Document Revised: 05/17/2013 Document Reviewed: 04/03/2013 [facility] Patient Information 2014 [place], [place].  Pregnancy, First Trimester The first trimester is the first 3 months your baby is growing inside you. It is important to follow your doctor's instructions. HOME CARE   Do not smoke.  Do not drink alcohol.  Only take medicine as told by your doctor.  Exercise.  Eat healthy foods. Eat regular, well-balanced meals.  Eat crackers before getting up in the morning.  Eat 4 to 5 small meals rather than 3 large meals.  Drink liquids between meals, not during meals.  Go to all appointments as told.  Take all vitamins or supplements as told by your doctor. GET HELP RIGHT AWAY IF:   You develop a fever.  There is bleeding from the vagina.  You develop severe belly (abdominal) or back pain.  You throw up more than 4 times and cannot keep fluids down.  You have severe dizziness or pass out (faint).  You have a headache, watery poop (diarrhea), pain with peeing (urinating), or cannot breath right.   Document Released: 01/13/2008 Document Revised: 10/19/2011 Document Reviewed: 01/13/2008 [facility] Patient Information 2014 [place], [place].

## 2014-01-14 NOTE — MAU Note (Signed)
Pt presents to MAU for repeat quant.

## 2014-06-11 ENCOUNTER — Encounter (HOSPITAL_COMMUNITY): Payer: Self-pay | Admitting: *Deleted

## 2014-09-23 ENCOUNTER — Emergency Department (HOSPITAL_COMMUNITY): Payer: BLUE CROSS/BLUE SHIELD

## 2014-09-23 ENCOUNTER — Encounter (HOSPITAL_COMMUNITY): Payer: Self-pay | Admitting: Emergency Medicine

## 2014-09-23 ENCOUNTER — Emergency Department (HOSPITAL_COMMUNITY)
Admission: EM | Admit: 2014-09-23 | Discharge: 2014-09-23 | Disposition: A | Discharge status: Home / Self Care | Payer: BLUE CROSS/BLUE SHIELD | Attending: Emergency Medicine | Admitting: Emergency Medicine

## 2014-09-23 DIAGNOSIS — Z3202 Encounter for pregnancy test, result negative: Secondary | ICD-10-CM | POA: Insufficient documentation

## 2014-09-23 DIAGNOSIS — N8320 Unspecified ovarian cysts: Secondary | ICD-10-CM | POA: Diagnosis not present

## 2014-09-23 DIAGNOSIS — N83202 Unspecified ovarian cyst, left side: Secondary | ICD-10-CM

## 2014-09-23 DIAGNOSIS — R109 Unspecified abdominal pain: Secondary | ICD-10-CM | POA: Diagnosis present

## 2014-09-23 DIAGNOSIS — Z79899 Other long term (current) drug therapy: Secondary | ICD-10-CM | POA: Insufficient documentation

## 2014-09-23 DIAGNOSIS — Z880 Allergy status to penicillin: Secondary | ICD-10-CM | POA: Diagnosis not present

## 2014-09-23 DIAGNOSIS — Z8719 Personal history of other diseases of the digestive system: Secondary | ICD-10-CM | POA: Diagnosis not present

## 2014-09-23 LAB — URINALYSIS, ROUTINE W REFLEX MICROSCOPIC
BILIRUBIN URINE: NEGATIVE
GLUCOSE, UA: NEGATIVE mg/dL
Ketones, ur: 15 mg/dL — AB
NITRITE: NEGATIVE
Protein, ur: 30 mg/dL — AB
Specific Gravity, Urine: 1.026 (ref 1.005–1.030)
Urobilinogen, UA: 1 mg/dL (ref 0.0–1.0)
pH: 6.5 (ref 5.0–8.0)

## 2014-09-23 LAB — COMPREHENSIVE METABOLIC PANEL
ALK PHOS: 59 U/L (ref 39–117)
ALT: 9 U/L (ref 0–35)
AST: 25 U/L (ref 0–37)
Albumin: 4.1 g/dL (ref 3.5–5.2)
Anion gap: 6 (ref 5–15)
BUN: 6 mg/dL (ref 6–23)
CO2: 30 mmol/L (ref 19–32)
Calcium: 9.2 mg/dL (ref 8.4–10.5)
Chloride: 108 mmol/L (ref 96–112)
Creatinine, Ser: 0.72 mg/dL (ref 0.50–1.10)
GFR calc Af Amer: >90 mL/min (ref >90)
GFR calc non Af Amer: >90 mL/min (ref >90)
GLUCOSE: 119 mg/dL — AB (ref 70–99)
Potassium: 4.5 mmol/L (ref 3.5–5.1)
Sodium: 144 mmol/L (ref 135–145)
Total Bilirubin: 1 mg/dL (ref 0.3–1.2)
Total Protein: 7.5 g/dL (ref 6.0–8.3)

## 2014-09-23 LAB — URINE MICROSCOPIC-ADD ON

## 2014-09-23 LAB — CBC WITH DIFFERENTIAL/PLATELET
BASOS ABS: 0 10*3/uL (ref 0.0–0.1)
BASOS PCT: 1 % (ref 0–1)
EOS ABS: 0 10*3/uL (ref 0.0–0.7)
EOS PCT: 0 % (ref 0–5)
HEMATOCRIT: 40.6 % (ref 36.0–46.0)
Hemoglobin: 13.4 g/dL (ref 12.0–15.0)
LYMPHS ABS: 1.1 10*3/uL (ref 0.7–4.0)
Lymphocytes Relative: 30 % (ref 12–46)
MCH: 29.1 pg (ref 26.0–34.0)
MCHC: 33 g/dL (ref 30.0–36.0)
MCV: 88.3 fL (ref 78.0–100.0)
MONO ABS: 0.2 10*3/uL (ref 0.1–1.0)
MONOS PCT: 4 % (ref 3–12)
NEUTROS ABS: 2.4 10*3/uL (ref 1.7–7.7)
Neutrophils Relative %: 65 % (ref 43–77)
Platelets: 216 10*3/uL (ref 150–400)
RBC: 4.6 MIL/uL (ref 3.87–5.11)
RDW: 12.3 % (ref 11.5–15.5)
WBC: 3.7 10*3/uL — AB (ref 4.0–10.5)

## 2014-09-23 LAB — LIPASE, BLOOD: Lipase: 25 U/L (ref 11–59)

## 2014-09-23 LAB — PREGNANCY, URINE: PREG TEST UR: NEGATIVE

## 2014-09-23 MED ORDER — MORPHINE SULFATE 4 MG/ML IJ SOLN
4.0000 mg | Freq: Once | INTRAMUSCULAR | Status: AC
  Administered 2014-09-23: 4 mg via INTRAVENOUS
  Filled 2014-09-23: qty 1

## 2014-09-23 MED ORDER — ONDANSETRON HCL 4 MG/2ML IJ SOLN
4.0000 mg | Freq: Once | INTRAMUSCULAR | Status: AC
  Administered 2014-09-23: 4 mg via INTRAVENOUS
  Filled 2014-09-23: qty 2

## 2014-09-23 MED ORDER — ONDANSETRON 8 MG PO TBDP: 8.0000 mg | ORAL_TABLET | Freq: Three times a day (TID) | ORAL | Status: DC | PRN

## 2014-09-23 MED ORDER — OXYCODONE-ACETAMINOPHEN 5-325 MG PO TABS: 1.0000 | ORAL_TABLET | ORAL | Status: DC | PRN

## 2014-09-23 MED ORDER — IBUPROFEN 600 MG PO TABS: 600.0000 mg | ORAL_TABLET | Freq: Three times a day (TID) | ORAL | Status: DC | PRN

## 2014-09-23 NOTE — Discharge Instructions (Signed)
Ovarian Cyst An ovarian cyst is a fluid-filled sac that forms on an ovary. The ovaries are small organs that produce eggs in women. Various types of cysts can form on the ovaries. Most are not cancerous. Many do not cause problems, and they often go away on their own. Some may cause symptoms and require treatment. Common types of ovarian cysts include:  Functional cysts--These cysts may occur every month during the menstrual cycle. This is normal. The cysts usually go away with the next menstrual cycle if the woman does not get pregnant. Usually, there are no symptoms with a functional cyst.  Endometrioma cysts--These cysts form from the tissue that lines the uterus. They are also called "chocolate cysts" because they become filled with blood that turns brown. This type of cyst can cause pain in the lower abdomen during intercourse and with your menstrual period.  Cystadenoma cysts--This type develops from the cells on the outside of the ovary. These cysts can get very big and cause lower abdomen pain and pain with intercourse. This type of cyst can twist on itself, cut off its blood supply, and cause severe pain. It can also easily rupture and cause a lot of pain.  Dermoid cysts--This type of cyst is sometimes found in both ovaries. These cysts may contain different kinds of body tissue, such as skin, teeth, hair, or cartilage. They usually do not cause symptoms unless they get very big.  Theca lutein cysts--These cysts occur when too much of a certain hormone (human chorionic gonadotropin) is produced and overstimulates the ovaries to produce an egg. This is most common after procedures used to assist with the conception of a baby (in vitro fertilization). CAUSES   Fertility drugs can cause a condition in which multiple large cysts are formed on the ovaries. This is called ovarian hyperstimulation syndrome.  A condition called polycystic ovary syndrome can cause hormonal imbalances that can lead to  nonfunctional ovarian cysts. SIGNS AND SYMPTOMS  Many ovarian cysts do not cause symptoms. If symptoms are present, they may include:  Pelvic pain or pressure.  Pain in the lower abdomen.  Pain during sexual intercourse.  Increasing girth (swelling) of the abdomen.  Abnormal menstrual periods.  Increasing pain with menstrual periods.  Stopping having menstrual periods without being pregnant. DIAGNOSIS  These cysts are commonly found during a routine or annual pelvic exam. Tests may be ordered to find out more about the cyst. These tests may include:  Ultrasound.  X-ray of the pelvis.  CT scan.  MRI.  Blood tests. TREATMENT  Many ovarian cysts go away on their own without treatment. Your health care provider may want to check your cyst regularly for 2-3 months to see if it changes. For women in menopause, it is particularly important to monitor a cyst closely because of the higher rate of ovarian cancer in menopausal women. When treatment is needed, it may include any of the following:  A procedure to drain the cyst (aspiration). This may be done using a long needle and ultrasound. It can also be done through a laparoscopic procedure. This involves using a thin, lighted tube with a tiny camera on the end (laparoscope) inserted through a small incision.  Surgery to remove the whole cyst. This may be done using laparoscopic surgery or an open surgery involving a larger incision in the lower abdomen.  Hormone treatment or birth control pills. These methods are sometimes used to help dissolve a cyst. HOME CARE INSTRUCTIONS   Only take over-the-counter   or prescription medicines as directed by your health care provider.  Follow up with your health care provider as directed.  Get regular pelvic exams and Pap tests. SEEK MEDICAL CARE IF:   Your periods are late, irregular, or painful, or they stop.  Your pelvic pain or abdominal pain does not go away.  Your abdomen becomes  larger or swollen.  You have pressure on your bladder or trouble emptying your bladder completely.  You have pain during sexual intercourse.  You have feelings of fullness, pressure, or discomfort in your stomach.  You lose weight for no apparent reason.  You feel generally ill.  You become constipated.  You lose your appetite.  You develop acne.  You have an increase in body and facial hair.  You are gaining weight, without changing your exercise and eating habits.  You think you are pregnant. SEEK IMMEDIATE MEDICAL CARE IF:   You have increasing abdominal pain.  You feel sick to your stomach (nauseous), and you throw up (vomit).  You develop a fever that comes on suddenly.  You have abdominal pain during a bowel movement.  Your menstrual periods become heavier than usual. MAKE SURE YOU:  Understand these instructions.  Will watch your condition.  Will get help right away if you are not doing well or get worse. Document Released: 07/27/2005 Document Revised: 08/01/2013 Document Reviewed: 04/03/2013 [employer] Patient Information 2015 [employer]. This information is not intended to replace advice given to you by your health care provider. Make sure you discuss any questions you have with your health care provider.  

## 2014-09-23 NOTE — ED Provider Notes (Signed)
CSN: [id number]     Arrival date & time 09/23/14  0449 History   First MD Initiated Contact with Patient 09/23/14 0507     Chief Complaint  Patient presents with  . Abdominal Pain      HPI Patient presents with left-sided abdominal pain associated nausea vomiting.  She denies diarrhea.  She reports this is more in her mid left abdomen.  Denies urinary symptoms.  No back pain or flank pain.  No chest pain or shortness of breath.  No other complaints.   Past Medical History  Diagnosis Date  . Anal fissure   . Constipation   . Palpitation    Past Surgical History  Procedure Laterality Date  . 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 No past surgeries     Family History  Problem Relation Age of Onset  . Diabetes Father   . Irritable bowel syndrome Mother   . Colon cancer Neg Hx    History  Substance Use Topics  . Smoking status: Never Smoker   . Smokeless tobacco: Never Used  . Alcohol Use: Yes   OB History    Gravida Para Term Preterm AB TAB SAB Ectopic Multiple Living   2 1 1       1      Review of Systems  All other systems reviewed and are negative.     Allergies  Penicillins  Home Medications   Prior to Admission medications   Medication Sig Start Date End Date Taking? Authorizing Provider  Prenatal Vit-Fe Fumarate-FA (PRENATAL VITAMINS PLUS) 27-1 MG TABS Take 1 tablet by mouth daily. 01/14/14   Marlinda Mikeanya Bailey, CNM  promethazine (PHENERGAN) 12.5 MG tablet Take 1 tablet (12.5 mg total) by mouth every 6 (six) hours as needed for nausea or vomiting. 01/14/14   Marlinda Mikeanya Bailey, CNM   BP 105/61 mmHg  Pulse 77  Temp(Src) 97.8 F (36.6 C) (Oral)  Resp 18  Ht 5\' 6"  (1.676 m)  Wt 178 lb (80.74 kg)  BMI 28.74 kg/m2  SpO2 97%  LMP 09/23/2014  Breastfeeding? Unknown Physical Exam  Constitutional: She is oriented to person, place, and time. She appears well-developed and well-nourished. No distress.  HENT:  Head: Normocephalic and atraumatic.  Eyes: EOM  are normal.  Neck: Normal range of motion.  Cardiovascular: Normal rate, regular rhythm and normal heart sounds.   Pulmonary/Chest: Effort normal and breath sounds normal.  Abdominal: Soft. She exhibits no distension.  Mild left lower quadrant abdominal tenderness without guarding or rebound  Musculoskeletal: Normal range of motion.  Neurological: She is alert and oriented to person, place, and time.  Skin: Skin is warm and dry.  Psychiatric: She has a normal mood and affect. Judgment normal.  Nursing note and vitals reviewed.   ED Course  Procedures (including critical care time) Labs Review Labs Reviewed  COMPREHENSIVE METABOLIC PANEL - Abnormal; Notable for the following:    Glucose, Bld 119 (*)    All other components within normal limits  CBC WITH DIFFERENTIAL/PLATELET - Abnormal; Notable for the following:    WBC 3.7 (*)    All other components within normal limits  URINALYSIS, ROUTINE W REFLEX MICROSCOPIC - Abnormal; Notable for the following:    Color, Urine RED (*)    APPearance CLOUDY (*)    Hgb urine dipstick LARGE (*)    Ketones, ur 15 (*)    Protein, ur 30 (*)    Leukocytes, UA SMALL (*)  All other components within normal limits  URINE MICROSCOPIC-ADD ON - Abnormal; Notable for the following:    Bacteria, UA FEW (*)    All other components within normal limits  LIPASE, BLOOD  PREGNANCY, URINE    Imaging Review Ct Abdomen Pelvis Wo Contrast  09/23/2014   CLINICAL DATA:  Acute onset of left flank pain, nausea and vomiting. Hematuria. Initial encounter.  EXAM: CT ABDOMEN AND PELVIS WITHOUT CONTRAST  TECHNIQUE: Multidetector CT imaging of the abdomen and pelvis was performed following the standard protocol without IV contrast.  COMPARISON:  Pelvic ultrasound performed 01/12/2014  FINDINGS: The visualized lung bases are clear.  The liver and spleen are unremarkable in appearance. The gallbladder is within normal limits. The pancreas and adrenal glands are  unremarkable.  The kidneys are unremarkable in appearance. Mildly increased attenuation is seen at the renal medulla bilaterally, nonspecific in appearance. There is no evidence of hydronephrosis. No renal or ureteral stones are seen. No perinephric stranding is appreciated.  No free fluid is identified. The small bowel is unremarkable in appearance. The stomach is within normal limits. No acute vascular abnormalities are seen.  The appendix is normal in caliber and contains air, without evidence for appendicitis. The colon is unremarkable in appearance.  The bladder is minimally distended and grossly unremarkable. The uterus is grossly unremarkable in appearance, though difficult to fully assess without contrast.  There is a 5.5 cm cystic lesion at the left adnexa. The right ovary is unremarkable in appearance. No inguinal lymphadenopathy is seen.  No acute osseous abnormalities are identified.  IMPRESSION: 1. 5.5 cm cystic lesion at the left adnexa. This is more likely physiologic given the patient's age, but given left flank symptoms, pelvic ultrasound is recommended for further evaluation. 2. Otherwise unremarkable CT of the abdomen and pelvis.   Electronically Signed   By: Roanna Raider M.D.   On: 09/23/2014 07:33  I personally reviewed the imaging tests through PACS system I reviewed available ER/hospitalization records through the EMR    EKG Interpretation None      MDM   Final diagnoses:  Abdominal pain, unspecified abdominal location  Left ovarian cyst    Suspect ovarian cysts causing the left-sided abdominal and pelvic pain.  Patient feels much better at this time.  Discharge home in good condition.  Much lower suspicion for ovarian cyst that she feels much better at this time.  Gynecology follow-up.  She understands to return to the ER for new or worsening symptoms.    Lyanne Co, MD 09/23/14 [phone]

## 2014-09-23 NOTE — ED Notes (Signed)
Patient transported to CT 

## 2014-09-23 NOTE — ED Notes (Signed)
Patient went out to dinner to celebrate her birthday tonight. Patient was eating dinner and drinking alcohol and began having LUQ abdominal pain with nausea and 3 episodes of emesis at 0200. Patient reports pain is constant and sharp and walking makes it better. LBM Today and currently on menstrual cycle.

## 2015-07-16 ENCOUNTER — Inpatient Hospital Stay (HOSPITAL_COMMUNITY)
Admission: AD | Admit: 2015-07-16 | Discharge: 2015-07-16 | Disposition: A | Discharge status: Home / Self Care | Payer: BLUE CROSS/BLUE SHIELD | Source: Ambulatory Visit | Attending: Obstetrics and Gynecology | Admitting: Obstetrics and Gynecology

## 2015-07-16 ENCOUNTER — Encounter (HOSPITAL_COMMUNITY): Payer: Self-pay | Admitting: *Deleted

## 2015-07-16 DIAGNOSIS — N362 Urethral caruncle: Secondary | ICD-10-CM | POA: Diagnosis not present

## 2015-07-16 DIAGNOSIS — N898 Other specified noninflammatory disorders of vagina: Secondary | ICD-10-CM | POA: Insufficient documentation

## 2015-07-16 DIAGNOSIS — Z880 Allergy status to penicillin: Secondary | ICD-10-CM | POA: Insufficient documentation

## 2015-07-16 DIAGNOSIS — N369 Urethral disorder, unspecified: Secondary | ICD-10-CM

## 2015-07-16 LAB — WET PREP, GENITAL
Clue Cells Wet Prep HPF POC: NONE SEEN
SPERM: NONE SEEN
Trich, Wet Prep: NONE SEEN
YEAST WET PREP: NONE SEEN

## 2015-07-16 MED ORDER — ESTRADIOL 0.1 MG/GM VA CREA: TOPICAL_CREAM | VAGINAL | Status: AC

## 2015-07-16 NOTE — MAU Note (Signed)
PT  SAYS SHE HAS BURNING  WHEN VOIDING.      SHE  WENT   TO DR COUISINS OFFICE  ON 12-1-  SWABBED--  GAVE  MEDS-    NOW WORSE-   AND  SHE SEES A BUMP    ALSO HAS A VAG D/C.    SHE  HAS  HAD UNPROTECTED  SEX-  ON 11-30.         DID NOT  CALL OFFICE  TONIGHT

## 2015-07-16 NOTE — MAU Provider Note (Signed)
History     CSN: [id number]  Arrival date and time: 07/16/15 0505    No chief complaint on file.  HPI Comments: [id number] non-pregnant female c/o vaginal "bump". First noticed it about 3 hrs ago. Urinating causes area to burn. Reports creamy non-odorous vaginal discharge x1 wk, was seen at [facility] at that time and given Flagyl for BV, hasn't finished Rx. GC/CMT testing was negative. No new partner. Contracepting with OCP.   OB History    Gravida Para Term Preterm AB TAB SAB Ectopic Multiple Living   2 1 1  1 1    1       Past Medical History  Diagnosis Date  . Anal fissure   . Constipation   . Palpitation     Past Surgical History  Procedure Laterality Date  . 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 No past surgeries      Family History  Problem Relation Age of Onset  . Diabetes Father   . Irritable bowel syndrome Mother   . Colon cancer Neg Hx     Social History  Substance Use Topics  . Smoking status: Never Smoker   . Smokeless tobacco: Never Used  . Alcohol Use: Yes     Comment: LAST DRANK-  LAST MTH    Allergies:  Allergies  Allergen Reactions  . Penicillins Rash    Prescriptions prior to admission  Medication Sig Dispense Refill Last Dose  . ibuprofen (ADVIL,MOTRIN) 600 MG tablet Take 1 tablet (600 mg total) by mouth every 8 (eight) hours as needed. 15 tablet 0 Past Month at Unknown time  . metroNIDAZOLE (FLAGYL) 500 MG tablet Take 500 mg by mouth 2 (two) times daily.   07/15/2015 at Unknown time  . ondansetron (ZOFRAN ODT) 8 MG disintegrating tablet Take 1 tablet (8 mg total) by mouth every 8 (eight) hours as needed for nausea or vomiting. 10 tablet 0 Past Week at Unknown time  . Multiple Vitamin (MULTIVITAMIN) tablet Take 1 tablet by mouth daily. [employer]   More than a month at Unknown time  . oxyCODONE-acetaminophen (PERCOCET/ROXICET) 5-325 MG per tablet Take 1 tablet by mouth every 4 (four) hours as needed for severe pain. 15 tablet 0 More  than a month at Unknown time  . Prenatal Vit-Fe Fumarate-FA (PRENATAL VITAMINS PLUS) 27-1 MG TABS Take 1 tablet by mouth daily. (Patient not taking: Reported on 09/23/2014) 90 tablet 6 Not Taking at Unknown time  . promethazine (PHENERGAN) 12.5 MG tablet Take 1 tablet (12.5 mg total) by mouth every 6 (six) hours as needed for nausea or vomiting. (Patient not taking: Reported on 09/23/2014) 10 tablet 0 Not Taking at Unknown time    Review of Systems  Constitutional: Negative.   HENT: Negative.   Eyes: Negative.   Respiratory: Negative.   Cardiovascular: Negative.   Gastrointestinal: Negative.   Genitourinary: Negative.   Musculoskeletal: Negative.   Skin: Negative.   Neurological: Negative.   Endo/Heme/Allergies: Negative.   Psychiatric/Behavioral: Negative.    Physical Exam   Blood pressure 122/77, pulse 104, temperature 98.3 F (36.8 C), temperature source Oral, resp. rate 20, height 5\' 5"  (1.651 m), weight 86.183 kg (190 lb), last menstrual period 06/21/2015, unknown if currently breastfeeding.  Physical Exam  Constitutional: She is oriented to person, place, and time. She appears well-developed and well-nourished.  HENT:  Head: Normocephalic.  Eyes: Pupils are equal, round, and reactive to light.  Neck: Normal range of motion.  Cardiovascular: Normal rate.   Respiratory: Effort normal.  GI: Soft.  Genitourinary:  6 mm red, pedunculated round nodule to inferior urethral meatus Speculum: thin, clear, scant discharge; parous, small ulceration noted to 1 o'clock and 6 o'clock Bimanual: anteverted, no tenderness or masses  Musculoskeletal: Normal range of motion.  Neurological: She is alert and oriented to person, place, and time.  Skin: Skin is warm and dry.   Results for YURITZA, PAULHUS (MRN [id number]) as of 07/16/2015 07:35  Ref. Range 07/16/2015 07:15  Yeast Wet Prep HPF POC Latest Ref Range: NONE SEEN  NONE SEEN  Trich, Wet Prep Latest Ref Range: NONE SEEN  NONE SEEN  Clue  Cells Wet Prep HPF POC Latest Ref Range: NONE SEEN  NONE SEEN  WBC, Wet Prep HPF POC Latest Ref Range: NONE SEEN  FEW (A)   MAU Course  Procedures   Assessment and Plan  Urethral caruncle Vaginal discharge  Discharge home Estrace cream-apply 1 pea sized amt to area daily x2 weeks  Tub soaks with baking soda for comfort Complete Flagyl Rx Follow-up at [facility] if not improving by 2 wks Reviewed use of ED-non emergent needs can be evaluated in office  Larin Depaoli, N 07/16/2015, 7:19 AM

## 2015-07-18 ENCOUNTER — Other Ambulatory Visit: Payer: Self-pay | Admitting: Obstetrics and Gynecology

## 2015-11-26 IMAGING — CT CT ABD-PELV W/O CM
2 of 4 series · 16 of 46 positions shown, 18 images · non-contrast
Comparison: Pelvic ultrasound performed 01/12/2014

CLINICAL DATA: Acute onset of left flank pain, nausea and vomiting.
Hematuria. Initial encounter.

EXAM:
CT ABDOMEN AND PELVIS WITHOUT CONTRAST
TECHNIQUE: Multidetector CT imaging of the abdomen and pelvis was performed
following the standard protocol without IV contrast.

[Series 2: stone study 5.0 i30f 1 · axial · 0.82mm/px · z∈[-793,-393]mm · 13 of 88 slices shown, 15 images]
[im 4/88  soft-tissue]
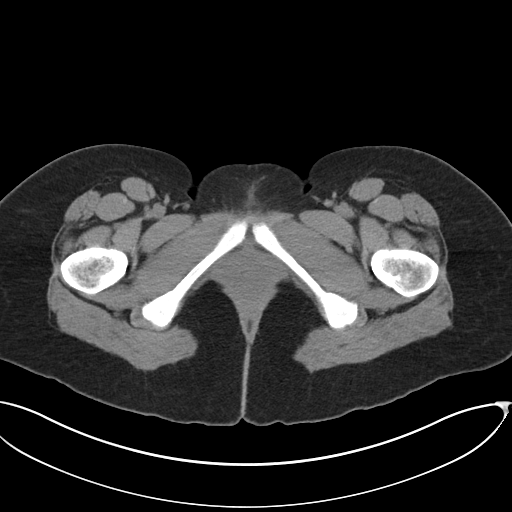
[im 4/88  bone]
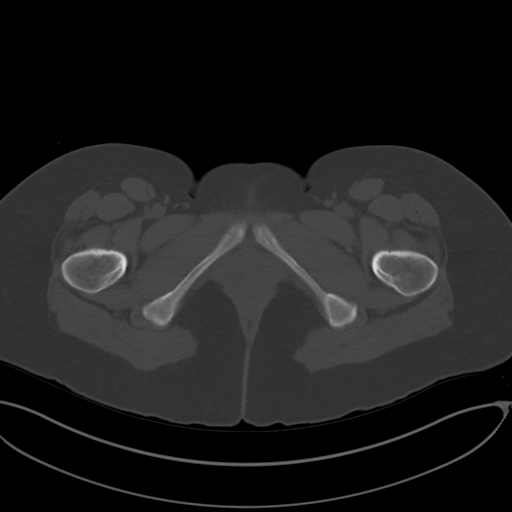
[im 11/88  soft-tissue]
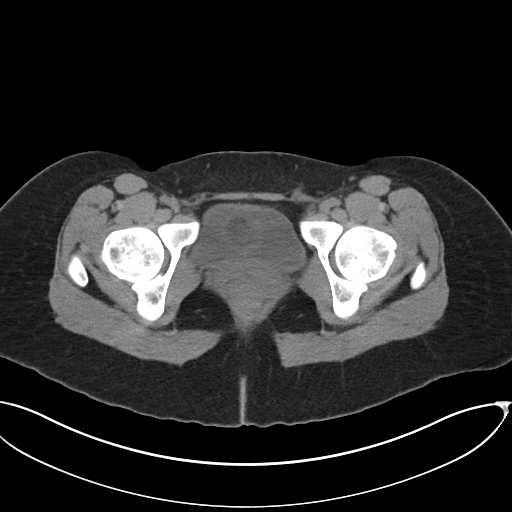
[im 19/88  soft-tissue]
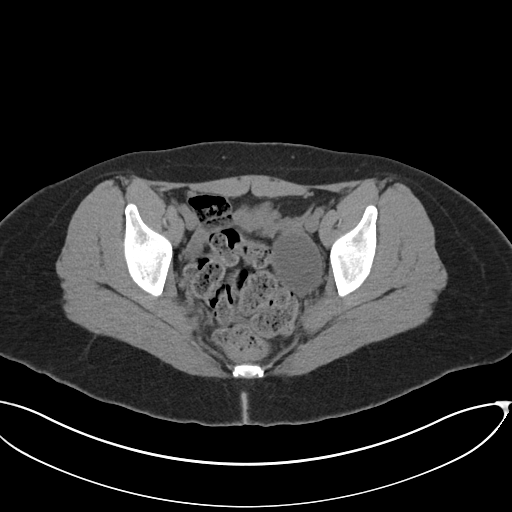
[im 26/88  soft-tissue]
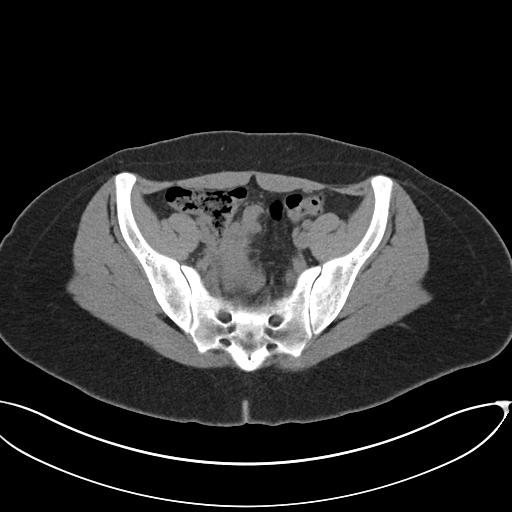
[im 30/88  soft-tissue]
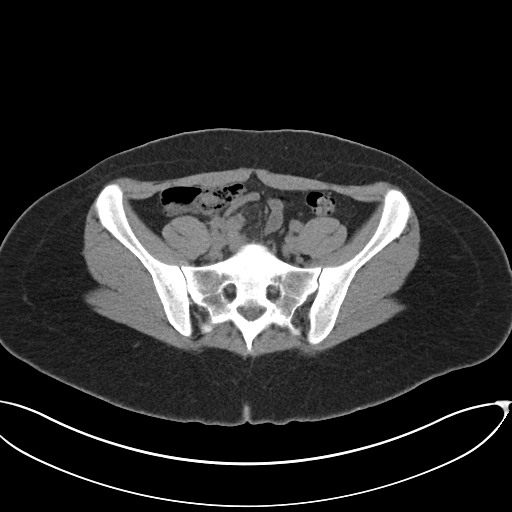
[im 37/88  soft-tissue]
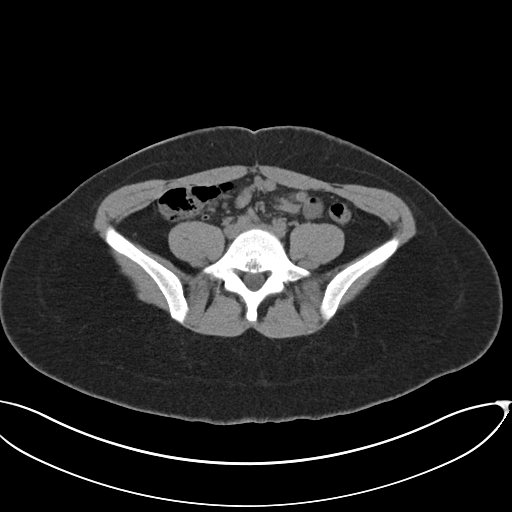
[im 44/88  soft-tissue]
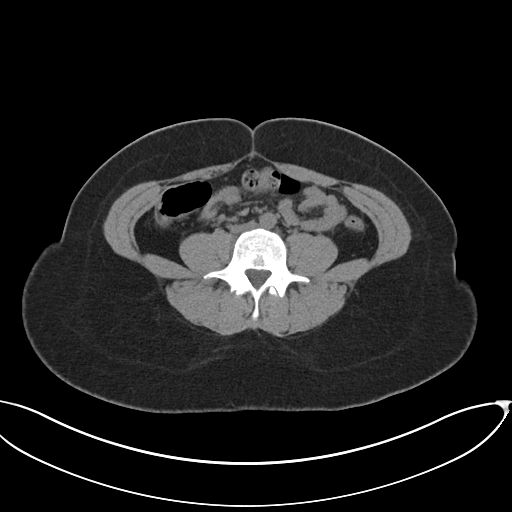
[im 51/88  soft-tissue]
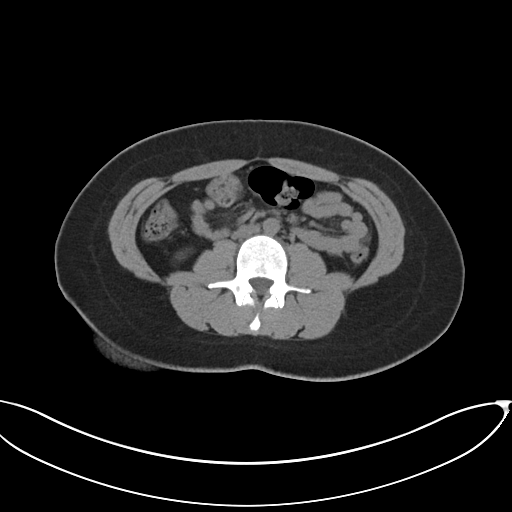
[im 59/88  soft-tissue]
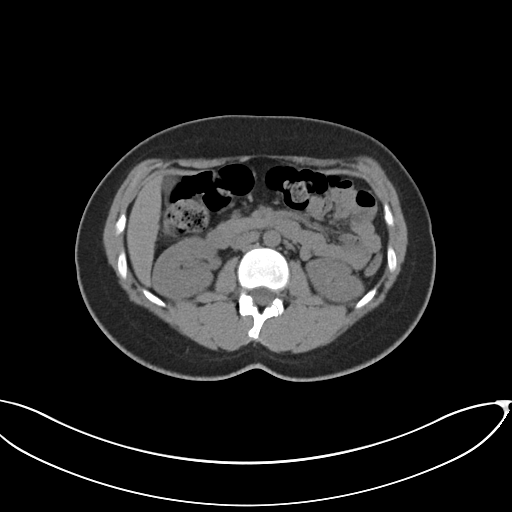
[im 59/88  bone]
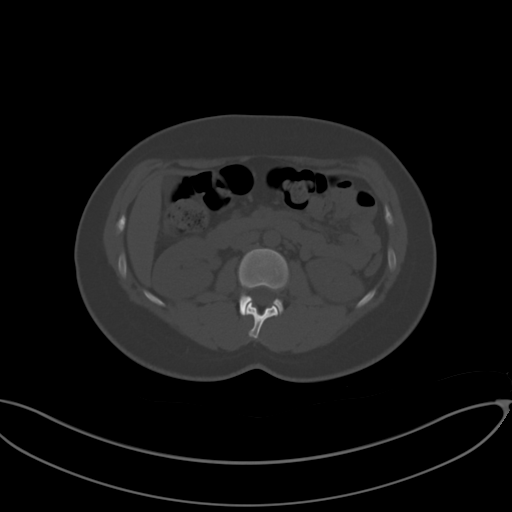
[im 62/88  soft-tissue]
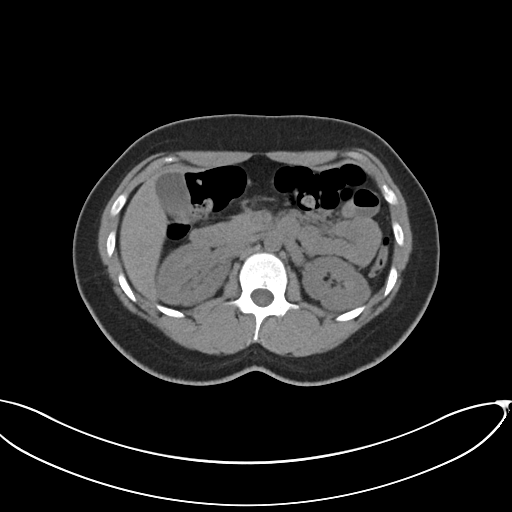
[im 69/88  soft-tissue]
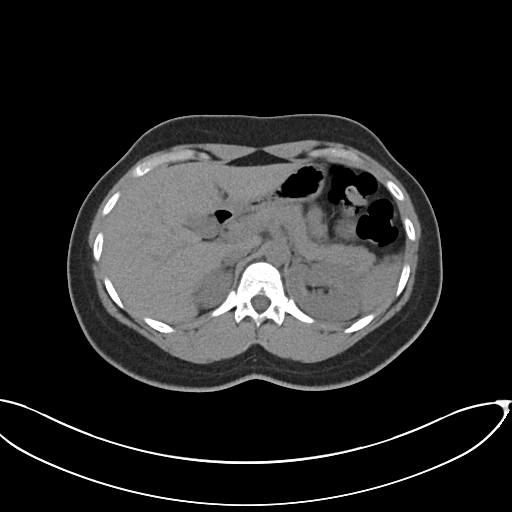
[im 77/88  soft-tissue]
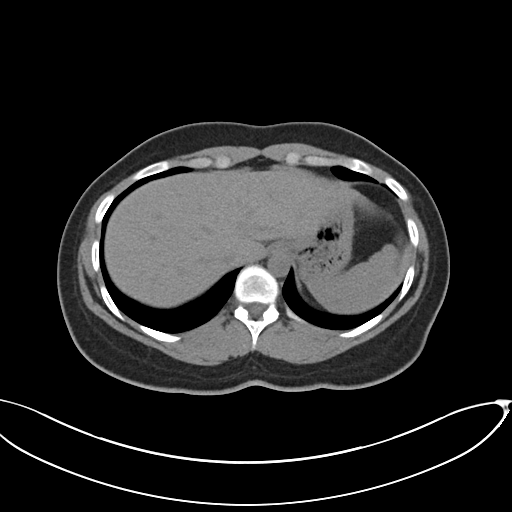
[im 84/88  soft-tissue]
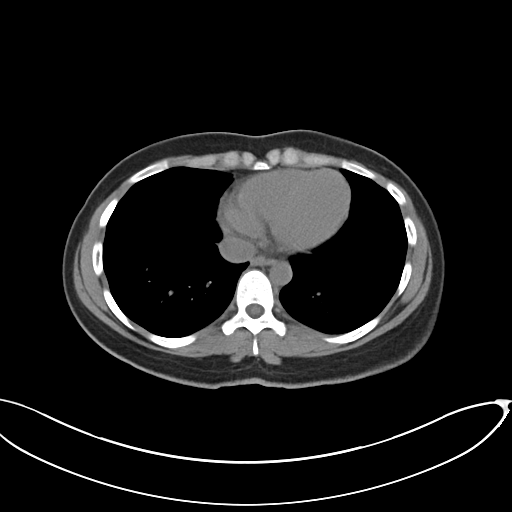

[Series 5: coronal soft tissue · coronal · 0.79mm/px · 3 of 60 slices shown]
[im 20/60  soft-tissue]
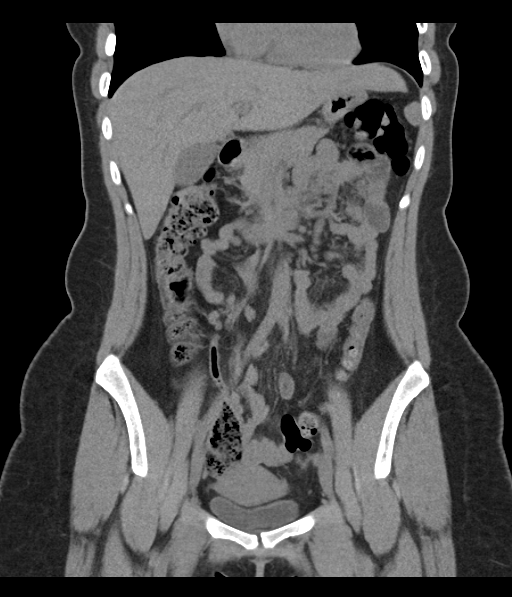
[im 27/60  soft-tissue]
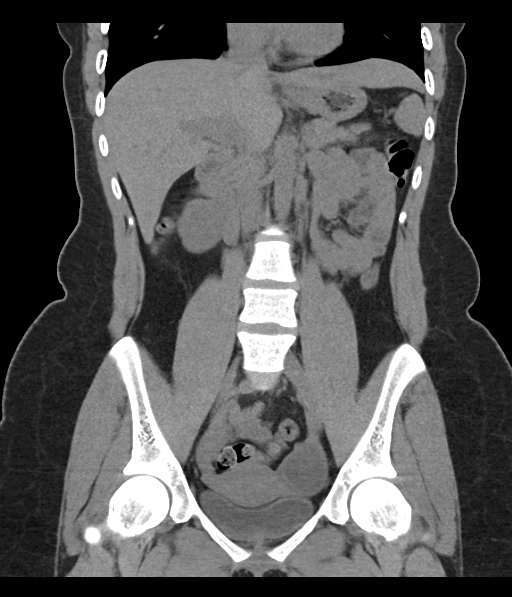
[im 33/60  soft-tissue]
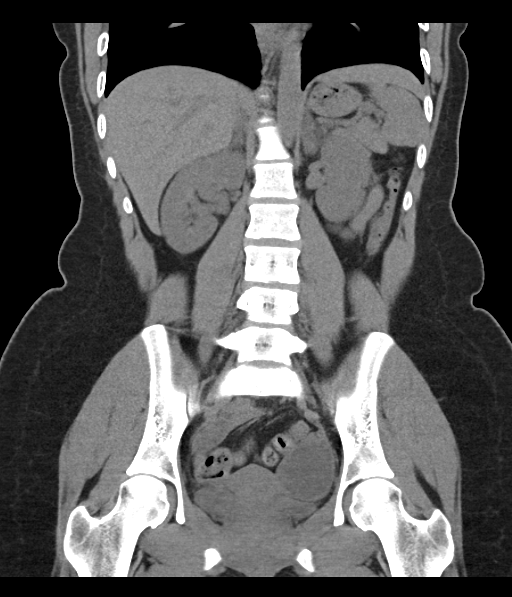

[16 of 46 positions shown; findings below may reference images not displayed]

FINDINGS: The visualized lung bases are clear.

The liver and spleen are unremarkable in appearance. The gallbladder
is within normal limits. The pancreas and adrenal glands are
unremarkable.

The kidneys are unremarkable in appearance. Mildly increased
attenuation is seen at the renal medulla bilaterally, nonspecific in
appearance. There is no evidence of hydronephrosis. No renal or
ureteral stones are seen. No perinephric stranding is appreciated.

No free fluid is identified. The small bowel is unremarkable in
appearance. The stomach is within normal limits. No acute vascular
abnormalities are seen.

The appendix is normal in caliber and contains air, without evidence
for appendicitis. The colon is unremarkable in appearance.

The bladder is minimally distended and grossly unremarkable. The
uterus is grossly unremarkable in appearance, though difficult to
fully assess without contrast.

There is a 5.5 cm cystic lesion at the left adnexa. The right ovary
is unremarkable in appearance. No inguinal lymphadenopathy is seen.

No acute osseous abnormalities are identified.
IMPRESSION: 1. 5.5 cm cystic lesion at the left adnexa. This is more likely
physiologic given the patient's age, but given left flank symptoms,
pelvic ultrasound is recommended for further evaluation.
2. Otherwise unremarkable CT of the abdomen and pelvis.

## 2016-02-26 ENCOUNTER — Inpatient Hospital Stay (HOSPITAL_COMMUNITY)
Admission: AD | Admit: 2016-02-26 | Discharge: 2016-02-26 | Disposition: A | Discharge status: Home / Self Care | Payer: BLUE CROSS/BLUE SHIELD | Source: Ambulatory Visit | Attending: Obstetrics and Gynecology | Admitting: Obstetrics and Gynecology

## 2016-02-26 ENCOUNTER — Encounter (HOSPITAL_COMMUNITY): Payer: Self-pay | Admitting: *Deleted

## 2016-02-26 ENCOUNTER — Inpatient Hospital Stay (HOSPITAL_COMMUNITY): Payer: BLUE CROSS/BLUE SHIELD

## 2016-02-26 DIAGNOSIS — O209 Hemorrhage in early pregnancy, unspecified: Secondary | ICD-10-CM

## 2016-02-26 DIAGNOSIS — O4691 Antepartum hemorrhage, unspecified, first trimester: Secondary | ICD-10-CM | POA: Insufficient documentation

## 2016-02-26 DIAGNOSIS — O208 Other hemorrhage in early pregnancy: Secondary | ICD-10-CM | POA: Insufficient documentation

## 2016-02-26 DIAGNOSIS — Z3A10 10 weeks gestation of pregnancy: Secondary | ICD-10-CM | POA: Insufficient documentation

## 2016-02-26 DIAGNOSIS — O30001 Twin pregnancy, unspecified number of placenta and unspecified number of amniotic sacs, first trimester: Secondary | ICD-10-CM | POA: Insufficient documentation

## 2016-02-26 LAB — URINALYSIS, ROUTINE W REFLEX MICROSCOPIC
BILIRUBIN URINE: NEGATIVE
GLUCOSE, UA: NEGATIVE mg/dL
Ketones, ur: 40 mg/dL — AB
LEUKOCYTES UA: NEGATIVE
Nitrite: NEGATIVE
PH: 6 (ref 5.0–8.0)
PROTEIN: NEGATIVE mg/dL
Specific Gravity, Urine: >1.03 — ABNORMAL HIGH (ref 1.005–1.030)

## 2016-02-26 LAB — POCT PREGNANCY, URINE: Preg Test, Ur: POSITIVE — AB

## 2016-02-26 LAB — URINE MICROSCOPIC-ADD ON

## 2016-02-26 NOTE — MAU Note (Signed)
Patient presents at [redacted] weeks gestation with c/o vaginal bleeding after lifting at work today. Also c/o lower back and pelvic discomfort.

## 2016-02-26 NOTE — MAU Provider Note (Signed)
History     Chief Complaint  Patient presents with  . Vaginal Bleeding   25 yo G3P1011 SBF @ 10 5/7 weeks twin gestation presents for evaluation of BRBPV after lifting at work and' On feet for 11 hrs. No cramping. No recent intercourse  OB History    Gravida Para Term Preterm AB TAB SAB Ectopic Multiple Living   3 1 1  1 1    1       Past Medical History  Diagnosis Date  . Anal fissure   . Constipation   . Palpitation     Past Surgical History  Procedure Laterality Date  . 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 Diabetes Father   . Irritable bowel syndrome Mother   . Colon cancer Neg Hx     Social History  Substance Use Topics  . Smoking status: Never Smoker   . Smokeless tobacco: Never Used  . Alcohol Use: Yes     Comment: LAST DRANK-  LAST MTH    Allergies:  Allergies  Allergen Reactions  . Penicillins Rash    Prescriptions prior to admission  Medication Sig Dispense Refill Last Dose  . metroNIDAZOLE (FLAGYL) 500 MG tablet Take 500 mg by mouth 2 (two) times daily.   07/15/2015 at Unknown time     Physical Exam   Blood pressure 136/86, pulse 112, temperature 98.1 F (36.7 C), temperature source Oral, resp. rate 16, height 5\' 6"  (1.676 m), weight 79.833 kg (176 lb), last menstrual period 06/21/2015, unknown if currently breastfeeding.  General appearance: alert, cooperative and no distress Lungs: clear to auscultation bilaterally Heart: regular rate and rhythm, S1, S2 normal, no murmur, click, rub or gallop Abdomen: gravid nontender Skin: Skin color, texture, turgor normal. No rashes or lesions  Pelvic: vulva nl lesion Vagina: scant brb vagina. No active bleeding noted Cervix parous closed. Long Uterus gravid nontender adn no palp mass  blood type AB positive IMP: threatened AB Twin gestation P) sonogram.  ED Course   addendum: sono done: twin gestation ( live) Di-Di. Small SCH  noted. 5.4 cm right ovarian cyst Threatened AB precautions sched NOB appts Abstain with bleeding MDM   Klinton Candelas A, MD 8:21 PM 02/26/2016

## 2016-02-26 NOTE — Discharge Instructions (Signed)
Pelvic Rest °Pelvic rest is sometimes recommended for women when:  °· The placenta is partially or completely covering the opening of the cervix (placenta previa). °· There is bleeding between the uterine wall and the amniotic sac in the first trimester (subchorionic hemorrhage). °· The cervix begins to open without labor starting (incompetent cervix, cervical insufficiency). °· The labor is too early (preterm labor). °HOME CARE INSTRUCTIONS °· Do not have sexual intercourse, stimulation, or an orgasm. °· Do not use tampons, douche, or put anything in the vagina. °· Do not lift anything over 10 pounds (4.5 kg). °· Avoid strenuous activity or straining your pelvic muscles. °SEEK MEDICAL CARE IF:  °· You have any vaginal bleeding during pregnancy. Treat this as a potential emergency. °· You have cramping pain felt low in the stomach (stronger than menstrual cramps). °· You notice vaginal discharge (watery, mucus, or bloody). °· You have a low, dull backache. °· There are regular contractions or uterine tightening. °SEEK IMMEDIATE MEDICAL CARE IF: °You have vaginal bleeding and have placenta previa.  °  °This information is not intended to replace advice given to you by your health care provider. Make sure you discuss any questions you have with your health care provider. °  °Document Released: 11/21/2010 Document Revised: 10/19/2011 Document Reviewed: 01/28/2015 °Elsevier Interactive Patient Education ©2016 Else[employer] Inc[employer] °Subchorionic Hematoma °A subchorionic hematoma is a gathering of blood between the outer wall of the placenta and the inner wall of the womb (uterus). The placenta is the organ that connects the fetus to the wall of the uterus. The placenta performs the feeding, breathing (oxygen to the fetus), and waste removal (excretory work) of the fetus.  °Subchorionic hematoma is the most common abnormality found on a result from ultrasonography done during the first trimester or early second trimester of  pregnancy. If there has been little or no vaginal bleeding, early small hematomas usually shrink on their own and do not affect your baby or pregnancy. The blood is gradually absorbed over 1-2 weeks. When bleeding starts later in pregnancy or the hematoma is larger or occurs in an older pregnant woman, the outcome may not be as good. Larger hematomas may get bigger, which increases the chances for miscarriage. Subchorionic hematoma also increases the risk of premature detachment of the placenta from the uterus, preterm (premature) labor, and stillbirth. °HOME CARE INSTRUCTIONS °· Stay on bed rest if your health care provider recommends this. Although bed rest will not prevent more bleeding or prevent a miscarriage, your health care provider may recommend bed rest until you are advised otherwise. °· Avoid heavy lifting (more than 10 lb [4.5 kg]), exercise, sexual intercourse, or douching as directed by your health care provider. °· Keep track of the number of pads you use each day and how soaked (saturated) they are. Write down this information. °· Do not use tampons. °· Keep all follow-up appointments as directed by your health care provider. Your health care provider may ask you to have follow-up blood tests or ultrasound tests or both. °SEEK IMMEDIATE MEDICAL CARE IF: °· You have severe cramps in your stomach, back, abdomen, or pelvis. °· You have a fever. °· You pass large clots or tissue. Save any tissue for your health care provider to look at. °· Your bleeding increases or you become lightheaded, feel weak, or have fainting episodes. °  °This information is not intended to replace advice given to you by your health care provider. Make sure you discuss any questions you have with   your health care provider. °  °Document Released: 11/11/2006 Document Revised: 08/17/2014 Document Reviewed: 02/23/2013 °Elsevier Interactive Patient Education ©2016 Else[employer] Inc. ° °

## 2016-03-12 LAB — OB RESULTS CONSOLE RPR: RPR: NONREACTIVE

## 2016-03-12 LAB — OB RESULTS CONSOLE ABO/RH: RH TYPE: POSITIVE

## 2016-03-12 LAB — OB RESULTS CONSOLE ANTIBODY SCREEN: Antibody Screen: NEGATIVE

## 2016-03-12 LAB — OB RESULTS CONSOLE HIV ANTIBODY (ROUTINE TESTING): HIV: NONREACTIVE

## 2016-03-12 LAB — OB RESULTS CONSOLE GC/CHLAMYDIA
Chlamydia: NEGATIVE
GC PROBE AMP, GENITAL: NEGATIVE

## 2016-03-12 LAB — OB RESULTS CONSOLE GBS: GBS: POSITIVE

## 2016-03-12 LAB — OB RESULTS CONSOLE RUBELLA ANTIBODY, IGM: RUBELLA: IMMUNE

## 2016-03-12 LAB — OB RESULTS CONSOLE HEPATITIS B SURFACE ANTIGEN: Hepatitis B Surface Ag: NEGATIVE

## 2016-05-25 ENCOUNTER — Encounter (HOSPITAL_COMMUNITY): Payer: Self-pay | Admitting: *Deleted

## 2016-05-25 ENCOUNTER — Inpatient Hospital Stay (HOSPITAL_COMMUNITY)
Admission: AD | Admit: 2016-05-25 | Discharge: 2016-05-25 | Disposition: A | Discharge status: Home / Self Care | Payer: BLUE CROSS/BLUE SHIELD | Source: Ambulatory Visit | Attending: Obstetrics and Gynecology | Admitting: Obstetrics and Gynecology

## 2016-05-25 DIAGNOSIS — O9989 Other specified diseases and conditions complicating pregnancy, childbirth and the puerperium: Secondary | ICD-10-CM | POA: Diagnosis not present

## 2016-05-25 DIAGNOSIS — Z3A23 23 weeks gestation of pregnancy: Secondary | ICD-10-CM | POA: Insufficient documentation

## 2016-05-25 DIAGNOSIS — R42 Dizziness and giddiness: Secondary | ICD-10-CM | POA: Diagnosis not present

## 2016-05-25 DIAGNOSIS — E860 Dehydration: Secondary | ICD-10-CM | POA: Diagnosis not present

## 2016-05-25 DIAGNOSIS — Z880 Allergy status to penicillin: Secondary | ICD-10-CM | POA: Diagnosis not present

## 2016-05-25 DIAGNOSIS — O26892 Other specified pregnancy related conditions, second trimester: Secondary | ICD-10-CM | POA: Diagnosis not present

## 2016-05-25 DIAGNOSIS — R002 Palpitations: Secondary | ICD-10-CM | POA: Insufficient documentation

## 2016-05-25 LAB — COMPREHENSIVE METABOLIC PANEL
ALT: 11 U/L — AB (ref 14–54)
AST: 20 U/L (ref 15–41)
Albumin: 3 g/dL — ABNORMAL LOW (ref 3.5–5.0)
Alkaline Phosphatase: 54 U/L (ref 38–126)
Anion gap: 8 (ref 5–15)
BUN: 9 mg/dL (ref 6–20)
CHLORIDE: 106 mmol/L (ref 101–111)
CO2: 20 mmol/L — AB (ref 22–32)
Calcium: 8.6 mg/dL — ABNORMAL LOW (ref 8.9–10.3)
Creatinine, Ser: 0.55 mg/dL (ref 0.44–1.00)
Glucose, Bld: 81 mg/dL (ref 65–99)
POTASSIUM: 3.5 mmol/L (ref 3.5–5.1)
SODIUM: 134 mmol/L — AB (ref 135–145)
Total Bilirubin: 0.4 mg/dL (ref 0.3–1.2)
Total Protein: 6.5 g/dL (ref 6.5–8.1)

## 2016-05-25 LAB — URINE MICROSCOPIC-ADD ON: RBC / HPF: NONE SEEN RBC/hpf (ref 0–5)

## 2016-05-25 LAB — CBC
HCT: 32.5 % — ABNORMAL LOW (ref 36.0–46.0)
Hemoglobin: 10.9 g/dL — ABNORMAL LOW (ref 12.0–15.0)
MCH: 29.6 pg (ref 26.0–34.0)
MCHC: 33.5 g/dL (ref 30.0–36.0)
MCV: 88.3 fL (ref 78.0–100.0)
PLATELETS: 198 10*3/uL (ref 150–400)
RBC: 3.68 MIL/uL — AB (ref 3.87–5.11)
RDW: 13.2 % (ref 11.5–15.5)
WBC: 8.3 10*3/uL (ref 4.0–10.5)

## 2016-05-25 LAB — URINALYSIS, ROUTINE W REFLEX MICROSCOPIC
BILIRUBIN URINE: NEGATIVE
Glucose, UA: NEGATIVE mg/dL
HGB URINE DIPSTICK: NEGATIVE
KETONES UR: 15 mg/dL — AB
NITRITE: NEGATIVE
PH: 6 (ref 5.0–8.0)
Protein, ur: NEGATIVE mg/dL
SPECIFIC GRAVITY, URINE: 1.02 (ref 1.005–1.030)

## 2016-05-25 MED ORDER — VITAMIN D 50 MCG (2000 UT) PO TABS: 4000.0000 [IU] | ORAL_TABLET | Freq: Every day | ORAL | 2 refills | Status: DC

## 2016-05-25 NOTE — MAU Provider Note (Signed)
History   [id number]   Chief Complaint  Patient presents with  . Dizziness  . Palpitations    HPI Leslie Ayers is a 25 y.o. female  G3P1011 at 38w3dIUP here with report of heart palpitations and feeling near-syncope at least once a day.  Reports that the sensation typically happens in the AM.  Feels a tight feeling in chest, followed by palpitations, and an overall feeling of warmth.  History of palpitations since 7 yrs of age.  Scheduled visit with cardiologist planned for 06/08/16.  Denies actual loss of consciousness.    Patient's last menstrual period was 06/21/2015.  OB History  Gravida Para Term Preterm AB Living  _0 SAB TAB Ectopic Multiple Live Births    1          # Outcome Date GA Lbr Len/2nd Weight Sex Delivery Anes PTL Lv  3 Current           2 TAB           1 Term               Past Medical History:  Diagnosis Date  . Anal fissure   . Constipation   . Palpitation     Family History  Problem Relation Age of Onset  . Diabetes Father   . Irritable bowel syndrome Mother   . Colon cancer Neg Hx     Social History   Social History  . Marital status: Single    Spouse name: N/A  . Number of children: N/A  . Years of education: N/A   Occupational History  . CNA    Social History Main Topics  . Smoking status: Never Smoker  . Smokeless tobacco: Never Used  . Alcohol use Yes     Comment: LAST DRANK-  LAST MTH  . Drug use: No  . Sexual activity: Yes    Birth control/ protection: None   Other Topics Concern  . Not on file   Social History Narrative  . No narrative on file    Allergies  Allergen Reactions  . Penicillins Rash    No current facility-administered medications on file prior to encounter.    Current Outpatient Prescriptions on File Prior to Encounter  Medication Sig Dispense Refill  . metroNIDAZOLE (FLAGYL) 500 MG tablet Take 500 mg by mouth 2 (two) times daily.     Results for orders placed or performed during the  hospital encounter of 05/25/16 (from the past 48 hour(s))  Urinalysis, [facility] w reflex microscopic [facility]     Status: Abnormal   Collection Time: 05/25/16  7:15 AM  Result Value Ref Range   Color, Urine YELLOW YELLOW   APPearance CLEAR CLEAR   Specific Gravity, Urine 1.020 1.005 - 1.030   pH 6.0 5.0 - 8.0   Glucose, UA NEGATIVE NEGATIVE mg/dL   Hgb urine dipstick NEGATIVE NEGATIVE   Bilirubin Urine NEGATIVE NEGATIVE   Ketones, ur 15 (A) NEGATIVE mg/dL   Protein, ur NEGATIVE NEGATIVE mg/dL   Nitrite NEGATIVE NEGATIVE   Leukocytes, UA SMALL (A) NEGATIVE  Urine microscopic-add on     Status: Abnormal   Collection Time: 05/25/16  7:15 AM  Result Value Ref Range   Squamous Epithelial / LPF 0-5 (A) NONE SEEN   WBC, UA 0-5 0 - 5 WBC/hpf   RBC / HPF NONE SEEN 0 - 5 RBC/hpf   Bacteria, UA FEW (A) NONE SEEN  CBC  Status: Abnormal   Collection Time: 05/25/16  7:36 AM  Result Value Ref Range   WBC 8.3 4.0 - 10.5 K/uL   RBC 3.68 (L) 3.87 - 5.11 MIL/uL   Hemoglobin 10.9 (L) 12.0 - 15.0 g/dL   HCT 32.5 (L) 36.0 - 46.0 %   MCV 88.3 78.0 - 100.0 fL   MCH 29.6 26.0 - 34.0 pg   MCHC 33.5 30.0 - 36.0 g/dL   RDW 13.2 11.5 - 15.5 %   Platelets 198 150 - 400 K/uL  Comprehensive metabolic panel     Status: Abnormal   Collection Time: 05/25/16  7:36 AM  Result Value Ref Range   Sodium 134 (L) 135 - 145 mmol/L   Potassium 3.5 3.5 - 5.1 mmol/L   Chloride 106 101 - 111 mmol/L   CO2 20 (L) 22 - 32 mmol/L   Glucose, Bld 81 65 - 99 mg/dL   BUN 9 6 - 20 mg/dL   Creatinine, Ser 0.55 0.44 - 1.00 mg/dL   Calcium 8.6 (L) 8.9 - 10.3 mg/dL   Total Protein 6.5 6.5 - 8.1 g/dL   Albumin 3.0 (L) 3.5 - 5.0 g/dL   AST 20 15 - 41 U/L   ALT 11 (L) 14 - 54 U/L   Alkaline Phosphatase 54 38 - 126 U/L   Total Bilirubin 0.4 0.3 - 1.2 mg/dL   GFR calc non Af Amer >60 >60 mL/min   GFR calc Af Amer >60 >60 mL/min    Comment: (NOTE) The eGFR has been calculated using the CKD EPI equation. This calculation  has not been validated in all clinical situations. eGFR's persistently <60 mL/min signify possible Chronic Kidney Disease.    Anion gap 8 5 - 15     Review of Systems  Constitutional: Negative for chills and fever.  Respiratory: Positive for chest tightness. Negative for cough and shortness of breath.   Cardiovascular: Positive for palpitations.  Gastrointestinal: Negative for nausea and vomiting.  Skin: Positive for pallor.  Neurological: Positive for dizziness and light-headedness. Negative for syncope and headaches.     Physical Exam   Vitals:   05/25/16 0748  BP: 117/81  Pulse: 106  Resp: 18  Temp: 98.1 F (36.7 C)  TempSrc: Oral    Physical Exam  Constitutional: She is oriented to person, place, and time. She appears well-developed and well-nourished.  HENT:  Head: Normocephalic.  Neck: Normal range of motion. Neck supple.  Cardiovascular: Normal rate, regular rhythm and normal heart sounds.   Respiratory: Effort normal and breath sounds normal. No respiratory distress.  GI: Soft. There is no tenderness.  Genitourinary: No bleeding in the vagina.  Musculoskeletal: Normal range of motion. She exhibits no edema.  Neurological: She is alert and oriented to person, place, and time.  Skin: Skin is warm and dry. She is not diaphoretic. No pallor.    MAU Course  Procedures None  MDM EKG ordered CBC/CMP ordered Orthostatic blood pressure ordered  0750 Report given to J. Rylin Saez who assumes care of patient  EKG NSR Patient up walking in the hall of MAU without complaints of palpitations or dizziness.  Discussed labs and HPI with Dr. Mancel Bale. Office records reviewed showed a low Vitamin D level. 4000 units recommended daily at home.   Gwen Pounds, CNM 05/25/2016 7:50 AM   Assessment and Plan  25 y.o. G3P1011 at 76w3dIUP here with ongoing dizziness and heart palpitations. This is a chronic condition/ symptom. The patient is scheduled to see  cardiology at  the end of the month.   A:  1. Dizziness   2. Mild dehydration     P:  Discharge home in stable condition Strict return precautions Keep cardiology appointment Follow up with OB as scheduled or sooner if needed.  Rx: Vitamin D 4000 units daily If symptoms worsen, patient instructed to return to MAU for cardiology consult to MAU.  Increase PO fluid intake.   Lezlie Lye, NP 05/25/2016 6:18 PM

## 2016-05-25 NOTE — MAU Note (Signed)
C/o feeling dizzy for past 2 weeks; c/o heart palpitations (pt has had palpitations since age 89;

## 2016-05-25 NOTE — Discharge Instructions (Signed)
Vitamin D Deficiency Vitamin D deficiency is when your body does not have enough vitamin D. Vitamin D is important to your body for many reasons:  It helps the body to absorb two important minerals, called calcium and phosphorus.  It plays a role in bone health.  It may help to prevent some diseases, such as diabetes and multiple sclerosis.  It plays a role in muscle function, including heart function. You can get vitamin D by:  Eating foods that naturally contain vitamin D.  Eating or drinking milk or other dairy products that have vitamin D added to them.  Taking a vitamin D supplement or a multivitamin supplement that contains vitamin D.  Being in the sun. Your body naturally makes vitamin D when your skin is exposed to sunlight. Your body changes the sunlight into a form of the vitamin that the body can use. If vitamin D deficiency is severe, it can cause a condition in which your bones become soft. In adults, this condition is called osteomalacia. In children, this condition is called rickets. CAUSES Vitamin D deficiency may be caused by:  Not eating enough foods that contain vitamin D.  Not getting enough sun exposure.  Having certain digestive system diseases that make it difficult for your body to absorb vitamin D. These diseases include Crohn disease, chronic pancreatitis, and cystic fibrosis.  Having a surgery in which a part of the stomach or a part of the small intestine is removed.  Being obese.  Having chronic kidney disease or liver disease. RISK FACTORS This condition is more likely to develop in:  Older people.  People who do not spend much time outdoors.  People who live in a long-term care facility.  People who have had broken bones.  People with weak or thin bones (osteoporosis).  People who have a disease or condition that changes how the body absorbs vitamin D.  People who have dark skin.  People who take certain medicines, such as steroid  medicines or certain seizure medicines.  People who are overweight or obese. SYMPTOMS In mild cases of vitamin D deficiency, there may not be any symptoms. If the condition is severe, symptoms may include:  Bone pain.  Muscle pain.  Falling often.  Broken bones caused by a minor injury. DIAGNOSIS This condition is usually diagnosed with a blood test.  TREATMENT Treatment for this condition may depend on what caused the condition. Treatment options include:  Taking vitamin D supplements.  Taking a calcium supplement. Your health care provider will suggest what dose is best for you. HOME CARE INSTRUCTIONS  Take medicines and supplements only as told by your health care provider.  Eat foods that contain vitamin D. Choices include:  Fortified dairy products, cereals, or juices. Fortified means that vitamin D has been added to the food. Check the label on the package to be sure.  Fatty fish, such as salmon or trout.  Eggs.  Oysters.  Do not use a tanning bed.  Maintain a healthy weight. Lose weight, if needed.  Keep all follow-up visits as told by your health care provider. This is important. SEEK MEDICAL CARE IF:  Your symptoms do not go away.  You feel like throwing up (nausea) or you throw up (vomit).  You have fewer bowel movements than usual or it is difficult for you to have a bowel movement (constipation).   This information is not intended to replace advice given to you by your health care provider. Make sure you discuss  any questions you have with your health care provider.   Document Released: 10/19/2011 Document Revised: 04/17/2015 Document Reviewed: 12/12/2014 Elsevier Interactive Patient Education 2016 Elsevier Inc. Dizziness Dizziness is a common problem. It is a feeling of unsteadiness or light-headedness. You may feel like you are about to faint. Dizziness can lead to injury if you stumble or fall. Anyone can become dizzy, but dizziness is more common  in older adults. This condition can be caused by a number of things, including medicines, dehydration, or illness. HOME CARE INSTRUCTIONS Taking these steps may help with your condition: Eating and Drinking  Drink enough fluid to keep your urine clear or pale yellow. This helps to keep you from becoming dehydrated. Try to drink more clear fluids, such as water.  Do not drink alcohol.  Limit your caffeine intake if directed by your health care provider.  Limit your salt intake if directed by your health care provider. Activity  Avoid making quick movements.  Rise slowly from chairs and steady yourself until you feel okay.  In the morning, first sit up on the side of the bed. When you feel okay, stand slowly while you hold onto something until you know that your balance is fine.  Move your legs often if you need to stand in one place for a long time. Tighten and relax your muscles in your legs while you are standing.  Do not drive or operate heavy machinery if you feel dizzy.  Avoid bending down if you feel dizzy. Place items in your home so that they are easy for you to reach without leaning over. Lifestyle  Do not use any tobacco products, including cigarettes, chewing tobacco, or electronic cigarettes. If you need help quitting, ask your health care provider.  Try to reduce your stress level, such as with yoga or meditation. Talk with your health care provider if you need help. General Instructions  Watch your dizziness for any changes.  Take medicines only as directed by your health care provider. Talk with your health care provider if you think that your dizziness is caused by a medicine that you are taking.  Tell a friend or a family member that you are feeling dizzy. If he or she notices any changes in your behavior, have this person call your health care provider.  Keep all follow-up visits as directed by your health care provider. This is important. SEEK MEDICAL CARE  IF:  Your dizziness does not go away.  Your dizziness or light-headedness gets worse.  You feel nauseous.  You have reduced hearing.  You have new symptoms.  You are unsteady on your feet or you feel like the room is spinning. SEEK IMMEDIATE MEDICAL CARE IF:  You vomit or have diarrhea and are unable to eat or drink anything.  You have problems talking, walking, swallowing, or using your arms, hands, or legs.  You feel generally weak.  You are not thinking clearly or you have trouble forming sentences. It may take a friend or family member to notice this.  You have chest pain, abdominal pain, shortness of breath, or sweating.  Your vision changes.  You notice any bleeding.  You have a headache.  You have neck pain or a stiff neck.  You have a fever.   This information is not intended to replace advice given to you by your health care provider. Make sure you discuss any questions you have with your health care provider.   Document Released: 01/20/2001 Document Revised: 12/11/2014 Document  Reviewed: 07/23/2014 Elsevier Interactive Patient Education [employer].

## 2016-05-25 NOTE — MAU Note (Signed)
After walking pt. Around the unit x1 she stated she feels fine.

## 2016-05-26 ENCOUNTER — Encounter: Payer: Self-pay | Admitting: Physician Assistant

## 2016-05-26 ENCOUNTER — Ambulatory Visit (INDEPENDENT_AMBULATORY_CARE_PROVIDER_SITE_OTHER): Payer: BLUE CROSS/BLUE SHIELD | Admitting: Physician Assistant

## 2016-05-26 VITALS — BP 108/69 | HR 96 | Ht 66.0 in | Wt 187.0 lb

## 2016-05-26 DIAGNOSIS — R55 Syncope and collapse: Secondary | ICD-10-CM | POA: Diagnosis not present

## 2016-05-26 LAB — TSH: TSH: 0.91 mIU/L

## 2016-05-26 NOTE — Patient Instructions (Addendum)
Medication Instructions:  No changes.  See medication list.  Labwork: Today - TSH  Testing/Procedures: Schedule an echocardiogram   Follow-Up: Dr. Ladona Ridgelaylor 6-8 WEEKS   Any Other Special Instructions Will Be Listed Below (If Applicable). Make sure you drink plenty of fluids. Keep the head of your bed at an angle. Remember to risk slowly when you stand. You can pump your calf muscles before you stand as well. You can get compression stockings at the pharmacy and wear them from the time you get up until the time you go to bed. You will probably want to take frequent breaks at work and try to sit when you can to avoid your heart rate from increasing causing you to get dizzy.  If you need a refill on your cardiac medications before your next appointment, please call your pharmacy.

## 2016-05-26 NOTE — Progress Notes (Signed)
Cardiology Office Note:    Date:  05/26/2016   ID:  Leslie SartoriusBrandi N Armendariz, DOB 07/07/1991, MRN [id number]  PCP:  No PCP Per Patient  Cardiologist:  New - seen by Dr. Lewayne BuntingGregg Taylor today    Referring MD: Osborn Cohooberts, Angela, MD   Chief Complaint  Patient presents with  . Loss of Consciousness    History of Present Illness:    Leslie Ayers is a 25 y.o. female at 7123 weeks gestation who presents for the evaluation of palpitations and syncope.  She has one daughter that is 6. This was an uncomplicated pregnancy. She is currently pregnant with 20. Over the last several weeks, she has noted chest discomfort and palpitations as well as near syncope with prolonged standing. Two weeks ago, she was in a store and started to fear that she would pass out. She tried several times to sit down but this did not help. On her way out the door, she had a syncopal episode. She still felt dizzy when she awoke. She denies exertional chest discomfort. She does note some shortness of breath with activity. She sleeps on an incline. She denies PND or edema.   Prior CV studies that were reviewed today include:    None   Past Medical History:  Diagnosis Date  . Anal fissure     Past Surgical History:  Procedure Laterality Date  . 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 Cholecalciferol (VITAMIN D) 2000 units tablet Take 2 tablets (4,000 Units total) by mouth daily.  . metroNIDAZOLE (FLAGYL) 500 MG tablet Take 500 mg by mouth 2 (two) times daily.  . Prenatal Vit-Fe Fumarate-FA (PRENATAL MULTIVITAMIN) TABS tablet Take 1 tablet by mouth daily at 12 noon.     Allergies:   Penicillins   Social History   Social History  . Marital status: Single    Spouse name: N/A  . Number of children: N/A  . Years of education: N/A   Occupational History  . CNA    Social History Main Topics  . Smoking status: Never Smoker  . Smokeless tobacco: Never  Used  . Alcohol use Yes     Comment: LAST DRANK-  LAST MTH  . Drug use: No  . Sexual activity: Yes    Birth control/ protection: None   Other Topics Concern  . None   Social History Narrative   1 daughter - age 726   Pregnant with twins.   [employer] - assembly line work   Single    Native to [place]     Family History:  The patient's family history includes Arrhythmia in her maternal grandmother; Diabetes in her maternal grandfather and mother; Irritable bowel syndrome in her mother; Stroke in her maternal grandmother and maternal uncle.   ROS:   Please see the history of present illness.    Review of Systems  Constitution: Positive for decreased appetite.  Cardiovascular: Positive for chest pain.  Respiratory: Positive for shortness of breath.   Musculoskeletal: Positive for back pain.  Neurological: Positive for dizziness.  Psychiatric/Behavioral: Positive for depression.   All other systems reviewed and are negative.   EKGs/Labs/Other Test Reviewed:    EKG:  EKG is  ordered today.  The ekg ordered today demonstrates NSR, HR 96, normal axis, nonspecific ST-T wave changes, QTc 439 ms, no evidence of pre-excitation  Recent Labs: 05/25/2016: ALT 11; BUN 9; Creatinine, Ser  0.55; Hemoglobin 10.9; Platelets 198; Potassium 3.5; Sodium 134   Recent Lipid Panel No results found for: CHOL, TRIG, HDL, CHOLHDL, VLDL, LDLCALC, LDLDIRECT   Physical Exam:    VS:  BP 108/69   Pulse 96   Ht 5\' 6"  (1.676 m)   Wt 187 lb (84.8 kg)   LMP 06/21/2015   BMI 30.18 kg/m     Orthostatic VS for the past 24 hrs (Last 3 readings):  BP- Lying Pulse- Lying BP- Sitting Pulse- Sitting BP- Standing at 0 minutes Pulse- Standing at 0 minutes BP- Standing at 3 minutes Pulse- Standing at 3 minutes  05/26/16 1021 113/74 96 117/70 89 110/72 118 121/80 130    Wt Readings from Last 3 Encounters:  05/26/16 187 lb (84.8 kg)  02/26/16 176 lb (79.8 kg)  07/16/15 190 lb (86.2 kg)     Physical Exam    Constitutional: She is oriented to person, place, and time. She appears well-developed and well-nourished. No distress.  HENT:  Head: Normocephalic and atraumatic.  Eyes: No scleral icterus.  Neck: No JVD present. Carotid bruit is not present. Thyromegaly present.  Cardiovascular: Normal rate, regular rhythm and normal heart sounds.   No murmur heard. Pulmonary/Chest: Effort normal. She has no wheezes. She has no rales.  Abdominal: There is no hepatomegaly.  Musculoskeletal: She exhibits no edema.  Neurological: She is alert and oriented to person, place, and time.  Skin: Skin is warm and dry.  Psychiatric: She has a normal mood and affect.    ASSESSMENT:    1. Vasovagal syncope    PLAN:    In order of problems listed above:  1. Syncope - She has postural tachycardia which is likely all related to volume shifts from her current pregnancy. HR increased from 96 to 130 from lying to standing today.  Luckily, her BP does not drop significantly.  Her ECG is unremarkable.  She was also seen with Dr. Lewayne Bunting today.  We plan conservative management.  She has been asked to push oral fluids and to add some extra salt. She should stand up slowly and try to use compression stockings.  We will obtain an echocardiogram to rule out structural heart disease and get a TSH to rule out thyroid disease.  She will FU with Dr. Lewayne Bunting in 6-8 weeks.    Medication Adjustments/Labs and Tests Ordered: Current medicines are reviewed at length with the patient today.  Concerns regarding medicines are outlined above.  Medication changes, Labs and Tests ordered today are outlined in the Patient Instructions noted below. Patient Instructions  Medication Instructions:  No changes.  See medication list.  Labwork: Today - TSH  Testing/Procedures: Schedule an echocardiogram   Follow-Up: Dr. Ladona Ridgel 6-8 WEEKS   Any Other Special Instructions Will Be Listed Below (If Applicable). Make sure you drink  plenty of fluids. Keep the head of your bed at an angle. Remember to risk slowly when you stand. You can pump your calf muscles before you stand as well. You can get compression stockings at the pharmacy and wear them from the time you get up until the time you go to bed. You will probably want to take frequent breaks at work and try to sit when you can to avoid your heart rate from increasing causing you to get dizzy.  If you need a refill on your cardiac medications before your next appointment, please call your pharmacy.   Signed, Tereso Newcomer, PA-C  05/26/2016 5:34 PM  [facility] [address] [place], [place], [place]  [address] Phone: [phone]; Fax: [phone]   EP Attending  Patient seen and examined. The exam as documented above reflects my findings as well. Agree with the findings as noted above. The patient has evidence of POTS/Autonomic dysfunction exacerbated by pregnancy with twins. She and her mother have been informed as to the treatment. My experience is that as pregnant patient's get into the 3rd trimester/end of second trimester there autonomic dysfunction usually improves. She will undergo 2D echo. Other instructions are listed above.  Leonia Reeves.D.

## 2016-05-27 ENCOUNTER — Telehealth: Payer: Self-pay | Admitting: *Deleted

## 2016-05-27 NOTE — Telephone Encounter (Signed)
Ptcb and has been notified of lab results by phone with verbal understanding. 

## 2016-05-27 NOTE — Telephone Encounter (Signed)
Lmtcb to go over lab results 

## 2016-06-08 ENCOUNTER — Ambulatory Visit: Payer: BLUE CROSS/BLUE SHIELD | Admitting: Cardiology

## 2016-06-11 ENCOUNTER — Other Ambulatory Visit: Payer: Self-pay

## 2016-06-11 ENCOUNTER — Ambulatory Visit (HOSPITAL_COMMUNITY): Payer: BLUE CROSS/BLUE SHIELD | Attending: Cardiology

## 2016-06-11 DIAGNOSIS — R55 Syncope and collapse: Secondary | ICD-10-CM | POA: Diagnosis not present

## 2016-06-12 ENCOUNTER — Encounter: Payer: Self-pay | Admitting: Physician Assistant

## 2016-06-12 ENCOUNTER — Telehealth: Payer: Self-pay | Admitting: *Deleted

## 2016-06-12 NOTE — Telephone Encounter (Signed)
Lmtcb to go over echo results.  

## 2016-06-12 NOTE — Telephone Encounter (Signed)
Pt notified of echo results by phone with verbal understanding 

## 2016-06-26 ENCOUNTER — Encounter: Payer: Self-pay | Admitting: Internal Medicine

## 2016-07-07 ENCOUNTER — Ambulatory Visit (INDEPENDENT_AMBULATORY_CARE_PROVIDER_SITE_OTHER): Payer: BLUE CROSS/BLUE SHIELD | Admitting: Internal Medicine

## 2016-07-07 ENCOUNTER — Encounter: Payer: Self-pay | Admitting: Internal Medicine

## 2016-07-07 ENCOUNTER — Encounter (INDEPENDENT_AMBULATORY_CARE_PROVIDER_SITE_OTHER): Payer: Self-pay

## 2016-07-07 VITALS — BP 102/62 | HR 90 | Ht 65.5 in | Wt 200.6 lb

## 2016-07-07 DIAGNOSIS — R55 Syncope and collapse: Secondary | ICD-10-CM | POA: Diagnosis not present

## 2016-07-07 NOTE — Progress Notes (Signed)
HPI Ms. Leslie Ayers is referred today by Mr. Alben SpittleWeaver for evaluation of syncope in the setting of a 3rd trimester gestation. She is a pleasant 25 yo woman with no significant past medical history. She has had 2 syncopal episodes since she has been pregnant. She was seen by Mr. Alben SpittleWeaver in our office a few weeks ago. At that time, she was noted to have a 35 point increase in her HR when going from sitting to standing. She notes that when she has a spell, she will feel clammy and sweaty and then pass out. She has not injured herself. Prior to passing out, she will experience visual changes. She has not bitten her tongue or had loss of bowel or bladder function. No chest pain. She is anxious about continuing to work. Allergies  Allergen Reactions  . Penicillins Rash    Has patient had a Has patient had a PCN reaction that required hospitalization No Has patient had a PCN reaction occurring within the last 10 years: No   PCN reaction causing immediate rash, facial/tongue/throat swelling, SOB or lightheadedness with hypotension: No Has patient had a PCN reaction causing severe rash involving mucus membranes or skin necrosis: No If all of the above answers are "NO", then may proceed with Cephalosporin use.      Current Outpatient Prescriptions  Medication Sig Dispense Refill  . Cholecalciferol (VITAMIN D) 2000 units tablet Take 2 tablets (4,000 Units total) by mouth daily. 60 tablet 2  . ferrous sulfate 325 (65 FE) MG tablet Take 325 mg by mouth 2 (two) times daily.  1  . metroNIDAZOLE (FLAGYL) 500 MG tablet Take 500 mg by mouth 2 (two) times daily.    . Prenatal Vit-Fe Fumarate-FA (PRENATAL MULTIVITAMIN) TABS tablet Take 1 tablet by mouth daily at 12 noon.     No current facility-administered medications for this visit.      Past Medical History:  Diagnosis Date  . Anal fissure   . History of echocardiogram    Echo 11/17: EF 60-65, no RWMA    ROS:   All systems reviewed and negative  except as noted in the HPI.   Past Surgical History:  Procedure Laterality Date  . 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 Irritable bowel syndrome Mother   . Diabetes Mother   . Arrhythmia Maternal Grandmother   . Stroke Maternal Grandmother   . Diabetes Maternal Grandfather   . Stroke Maternal Uncle   . Colon cancer Neg Hx      Social History   Social History  . Marital status: Single    Spouse name: N/A  . Number of children: N/A  . Years of education: N/A   Occupational History  . CNA    Social History Main Topics  . Smoking status: Never Smoker  . Smokeless tobacco: Never Used  . Alcohol use Yes     Comment: LAST DRANK-  LAST MTH  . Drug use: No  . Sexual activity: Yes    Birth control/ protection: None   Other Topics Concern  . Not on file   Social History Narrative   1 daughter - age 486   Pregnant with twins.   [employer] - assembly line work   Single    Native to [place]     BP 102/62   Pulse 90   Ht 5' 5.5" (1.664 m)  Wt 200 lb 9.6 oz (91 kg)   LMP 06/21/2015   BMI 32.87 kg/m   Physical Exam:  Well appearing NAD HEENT: Unremarkable Neck:  No JVD, no thyromegally Lymphatics:  No adenopathy Back:  No CVA tenderness Lungs:  Clear HEART:  Regular rate rhythm, no murmurs, no rubs, no clicks Abd:  soft, positive bowel sounds, no organomegally, no rebound, no guarding Ext:  2 plus pulses, no edema, no cyanosis, no clubbing Skin:  No rashes no nodules Neuro:  CN II through XII intact, motor grossly intact  EKG - Reviewed - NSR   Assess/Plan: 1. Syncope - her symptoms are neurally mediated. She is encouraged to maintain a high salt, high fluid diet, not to miss any meals and avoid dehydration. When she feels a spell coming on, she is encouraged to sit or lie down. 2. Hyperemesis - she is encouraged to increase her oral intake as much as possible. She has been able to gain  weight during her pregnancy. This has improved in the past 2 weeks.  Leonia ReevesGregg Bracy Pepper,M.D.

## 2016-07-07 NOTE — Patient Instructions (Signed)
Medication Instructions:  Your physician recommends that you continue on your current medications as directed. Please refer to the Current Medication list given to you today.   Labwork: None Ordered   Testing/Procedures: None Ordered   Follow-Up: Your physician wants you to follow-up in: 6 months with Dr. Ladona Ridgelaylor. You will receive a reminder letter in the mail two months in advance. If you don't receive a letter, please call our office to schedule the follow-up appointment.   If you need a refill on your cardiac medications before your next appointment, please call your pharmacy.

## 2016-07-29 ENCOUNTER — Encounter (HOSPITAL_COMMUNITY): Payer: Self-pay

## 2016-07-29 ENCOUNTER — Inpatient Hospital Stay (HOSPITAL_COMMUNITY)
Admission: AD | Admit: 2016-07-29 | Discharge: 2016-07-29 | Disposition: A | Discharge status: Home / Self Care | Payer: BLUE CROSS/BLUE SHIELD | Source: Ambulatory Visit | Attending: Obstetrics and Gynecology | Admitting: Obstetrics and Gynecology

## 2016-07-29 DIAGNOSIS — O26892 Other specified pregnancy related conditions, second trimester: Secondary | ICD-10-CM

## 2016-07-29 DIAGNOSIS — Z800 Family history of malignant neoplasm of digestive organs: Secondary | ICD-10-CM | POA: Diagnosis not present

## 2016-07-29 DIAGNOSIS — Z79899 Other long term (current) drug therapy: Secondary | ICD-10-CM | POA: Insufficient documentation

## 2016-07-29 DIAGNOSIS — Z833 Family history of diabetes mellitus: Secondary | ICD-10-CM | POA: Diagnosis not present

## 2016-07-29 DIAGNOSIS — Z8379 Family history of other diseases of the digestive system: Secondary | ICD-10-CM | POA: Diagnosis not present

## 2016-07-29 DIAGNOSIS — O30043 Twin pregnancy, dichorionic/diamniotic, third trimester: Secondary | ICD-10-CM | POA: Insufficient documentation

## 2016-07-29 DIAGNOSIS — Z880 Allergy status to penicillin: Secondary | ICD-10-CM | POA: Insufficient documentation

## 2016-07-29 DIAGNOSIS — Z3A32 32 weeks gestation of pregnancy: Secondary | ICD-10-CM | POA: Diagnosis not present

## 2016-07-29 DIAGNOSIS — O26893 Other specified pregnancy related conditions, third trimester: Secondary | ICD-10-CM | POA: Diagnosis present

## 2016-07-29 DIAGNOSIS — R0602 Shortness of breath: Secondary | ICD-10-CM | POA: Insufficient documentation

## 2016-07-29 DIAGNOSIS — Z823 Family history of stroke: Secondary | ICD-10-CM | POA: Diagnosis not present

## 2016-07-29 LAB — CBC
HEMATOCRIT: 33.7 % — AB (ref 36.0–46.0)
Hemoglobin: 11.3 g/dL — ABNORMAL LOW (ref 12.0–15.0)
MCH: 30.1 pg (ref 26.0–34.0)
MCHC: 33.5 g/dL (ref 30.0–36.0)
MCV: 89.9 fL (ref 78.0–100.0)
Platelets: 181 10*3/uL (ref 150–400)
RBC: 3.75 MIL/uL — AB (ref 3.87–5.11)
RDW: 13.3 % (ref 11.5–15.5)
WBC: 9.5 10*3/uL (ref 4.0–10.5)

## 2016-07-29 LAB — COMPREHENSIVE METABOLIC PANEL
ALT: 11 U/L — AB (ref 14–54)
AST: 17 U/L (ref 15–41)
Albumin: 2.8 g/dL — ABNORMAL LOW (ref 3.5–5.0)
Alkaline Phosphatase: 76 U/L (ref 38–126)
Anion gap: 7 (ref 5–15)
BILIRUBIN TOTAL: 0.3 mg/dL (ref 0.3–1.2)
BUN: 6 mg/dL (ref 6–20)
CO2: 21 mmol/L — ABNORMAL LOW (ref 22–32)
CREATININE: 0.46 mg/dL (ref 0.44–1.00)
Calcium: 8.4 mg/dL — ABNORMAL LOW (ref 8.9–10.3)
Chloride: 107 mmol/L (ref 101–111)
GFR calc Af Amer: >60 mL/min (ref >60)
Glucose, Bld: 79 mg/dL (ref 65–99)
Potassium: 3.4 mmol/L — ABNORMAL LOW (ref 3.5–5.1)
Sodium: 135 mmol/L (ref 135–145)
TOTAL PROTEIN: 6.4 g/dL — AB (ref 6.5–8.1)

## 2016-07-29 LAB — URINALYSIS, ROUTINE W REFLEX MICROSCOPIC
Bilirubin Urine: NEGATIVE
Glucose, UA: NEGATIVE mg/dL
HGB URINE DIPSTICK: NEGATIVE
Ketones, ur: 20 mg/dL — AB
Leukocytes, UA: NEGATIVE
Nitrite: NEGATIVE
Protein, ur: NEGATIVE mg/dL
SPECIFIC GRAVITY, URINE: 1.012 (ref 1.005–1.030)
pH: 6 (ref 5.0–8.0)

## 2016-07-29 NOTE — MAU Provider Note (Signed)
Chief Complaint:  Shortness of Breath   First Provider Initiated Contact with Patient 07/29/16 1912      HPI: Leslie SartoriusBrandi N Whitcher is a 25 y.o. G3P1011 at 8281w1d who presents to maternity admissions reporting shortness of breath with exertion.  She reports this episode started 3 days ago but she has had this symptom before during the pregnancy. She was seen in MAU 2 months ago and followed up with her OB/Gyn and cardiology afterwards. Cardiology cleared her with plan for her to stay hydrated and eat more regularly to improve her symptoms.  She reports that the shortness of breath is the same as 2 months ago but when it occurs it scares her because she is at home alone with her young child.  She reports she is eating and drinking regularly. Rest does make it better. Exertion/walking makes it worse.  It is intermittent and unchanged in intensity during the pregnancy.   She reports good fetal movement, denies abdominal cramping, LOF, vaginal bleeding, vaginal itching/burning, urinary symptoms, h/a, dizziness, n/v, or fever/chills.    HPI  Past Medical History: Past Medical History:  Diagnosis Date  . Anal fissure   . History of echocardiogram    Echo 11/17: EF 60-65, no RWMA    Past obstetric history: OB History  Gravida Para Term Preterm AB Living  3 1 1   1 1   SAB TAB Ectopic Multiple Live Births    1          # Outcome Date GA Lbr Len/2nd Weight Sex Delivery Anes PTL Lv  3 Current           2 TAB           1 Term               Past Surgical History: Past Surgical History:  Procedure Laterality Date  . 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 Irritable bowel syndrome Mother   . Diabetes Mother   . Arrhythmia Maternal Grandmother   . Stroke Maternal Grandmother   . Diabetes Maternal Grandfather   . Stroke Maternal Uncle   . Colon cancer Neg Hx     Social History: Social History  Substance Use  Topics  . Smoking status: Never Smoker  . Smokeless tobacco: Never Used  . Alcohol use Yes     Comment: LAST DRANK-  LAST MTH    Allergies:  Allergies  Allergen Reactions  . Penicillins Rash    Has patient had a Has patient had a PCN reaction that required hospitalization No Has patient had a PCN reaction occurring within the last 10 years: No   PCN reaction causing immediate rash, facial/tongue/throat swelling, SOB or lightheadedness with hypotension: No Has patient had a PCN reaction causing severe rash involving mucus membranes or skin necrosis: No If all of the above answers are "NO", then may proceed with Cephalosporin use.     Meds:  Prescriptions Prior to Admission  Medication Sig Dispense Refill Last Dose  . Cholecalciferol (VITAMIN D) 2000 units tablet Take 2 tablets (4,000 Units total) by mouth daily. 60 tablet 2 07/29/2016 at Unknown time  . ferrous sulfate 325 (65 FE) MG tablet Take 325 mg by mouth 2 (two) times daily.  1 07/28/2016 at Unknown time  . Prenatal Vit-Fe Fumarate-FA (PRENATAL MULTIVITAMIN) TABS tablet Take 1 tablet by mouth daily at 12 noon.  07/28/2016 at Unknown time    ROS:  Review of Systems  Constitutional: Negative for chills, fatigue and fever.  Eyes: Negative for visual disturbance.  Respiratory: Negative for shortness of breath.   Cardiovascular: Negative for chest pain.  Gastrointestinal: Negative for abdominal pain, nausea and vomiting.  Genitourinary: Positive for pelvic pain. Negative for difficulty urinating, dysuria, flank pain, vaginal bleeding, vaginal discharge and vaginal pain.  Neurological: Negative for dizziness and headaches.  Psychiatric/Behavioral: Negative.      I have reviewed patient's Past Medical Hx, Surgical Hx, Family Hx, Social Hx, medications and allergies.   Physical Exam   Patient Vitals for the past 24 hrs:  BP Temp Temp src Pulse Resp SpO2 Height Weight  07/29/16 1946 - - - 100 - 100 % - -  07/29/16 1941 -  - - 101 - 100 % - -  07/29/16 1926 - - - 99 - 100 % - -  07/29/16 1915 - - - 101 - 100 % - -  07/29/16 1856 - - - 115 - - - -  07/29/16 1851 - - - 106 - 99 % - -  07/29/16 1846 - - - - - 99 % - -  07/29/16 1841 - - - 109 - - - -  07/29/16 1814 111/68 98.4 F (36.9 C) Oral 96 16 100 % 5\' 6"  (1.676 m) 204 lb (92.5 kg)  07/29/16 1813 - - - - - 100 % - -   Constitutional: Well-developed, well-nourished female in no acute distress.  Cardiovascular: normal rate Respiratory: normal effort GI: Abd soft, non-tender, gravid appropriate for gestational age.  MS: Extremities nontender, no edema, normal ROM Neurologic: Alert and oriented x 4.  GU: Neg CVAT.     FHT: Baby A: Baseline 145 , moderate variability, accelerations present, no decelerations Baby B: Baseline 145 , moderate variability, accelerations present, no decelerations Contractions: Occasional, mild to palpation   Labs: Results for orders placed or performed during the hospital encounter of 07/29/16 (from the past 24 hour(s))  Urinalysis, Routine w reflex microscopic     Status: Abnormal   Collection Time: 07/29/16  6:40 PM  Result Value Ref Range   Color, Urine YELLOW YELLOW   APPearance HAZY (A) CLEAR   Specific Gravity, Urine 1.012 1.005 - 1.030   pH 6.0 5.0 - 8.0   Glucose, UA NEGATIVE NEGATIVE mg/dL   Hgb urine dipstick NEGATIVE NEGATIVE   Bilirubin Urine NEGATIVE NEGATIVE   Ketones, ur 20 (A) NEGATIVE mg/dL   Protein, ur NEGATIVE NEGATIVE mg/dL   Nitrite NEGATIVE NEGATIVE   Leukocytes, UA NEGATIVE NEGATIVE  CBC     Status: Abnormal   Collection Time: 07/29/16  7:28 PM  Result Value Ref Range   WBC 9.5 4.0 - 10.5 K/uL   RBC 3.75 (L) 3.87 - 5.11 MIL/uL   Hemoglobin 11.3 (L) 12.0 - 15.0 g/dL   HCT 16.1 (L) 09.6 - 04.5 %   MCV 89.9 78.0 - 100.0 fL   MCH 30.1 26.0 - 34.0 pg   MCHC 33.5 30.0 - 36.0 g/dL   RDW 40.9 81.1 - 91.4 %   Platelets 181 150 - 400 K/uL  Comprehensive metabolic panel     Status: Abnormal    Collection Time: 07/29/16  7:28 PM  Result Value Ref Range   Sodium 135 135 - 145 mmol/L   Potassium 3.4 (L) 3.5 - 5.1 mmol/L   Chloride 107 101 - 111 mmol/L   CO2 21 (L) 22 - 32 mmol/L  Glucose, Bld 79 65 - 99 mg/dL   BUN 6 6 - 20 mg/dL   Creatinine, Ser 1.610.46 0.44 - 1.00 mg/dL   Calcium 8.4 (L) 8.9 - 10.3 mg/dL   Total Protein 6.4 (L) 6.5 - 8.1 g/dL   Albumin 2.8 (L) 3.5 - 5.0 g/dL   AST 17 15 - 41 U/L   ALT 11 (L) 14 - 54 U/L   Alkaline Phosphatase 76 38 - 126 U/L   Total Bilirubin 0.3 0.3 - 1.2 mg/dL   GFR calc non Af Amer >60 >60 mL/min   GFR calc Af Amer >60 >60 mL/min   Anion gap 7 5 - 15      Imaging:  No results found.  MAU Course/MDM: I have ordered labs and reviewed results.  NST reviewed Reviewed previous documentation from MAU, OB , and cardiology visits Consult Dr Charlotta Newtonzan with presentation, exam findings and test results.  No acute findings today. Likely SOB r/t pregnancy causing anxiety for patient.   Reassurance provided. Pt to f/u in office.  Return to MAU for emergencies.  Pt stable at time of discharge.   Assessment: 1. Shortness of breath due to pregnancy in second trimester   2. Dichorionic diamniotic twin pregnancy in third trimester     Plan: Discharge home Labor precautions and fetal kick counts  Follow-up Information    [facility] Follow up.   Specialty:  Obstetrics and Gynecology Why:  As scheduled, return to MAU as needed for emergencies Contact information: [address] [place] [address] [phone]          Allergies as of 07/29/2016      Reactions   Penicillins Rash   Has patient had a Has patient had a PCN reaction that required hospitalization No Has patient had a PCN reaction occurring within the last 10 years: No   PCN reaction causing immediate rash, facial/tongue/throat swelling, SOB or lightheadedness with hypotension: No Has patient had a PCN reaction causing severe rash involving  mucus membranes or skin necrosis: No If all of the above answers are "NO", then may proceed with Cephalosporin use.      Medication List    TAKE these medications   ferrous sulfate 325 (65 FE) MG tablet Take 325 mg by mouth 2 (two) times daily.   prenatal multivitamin Tabs tablet Take 1 tablet by mouth daily at 12 noon.   Vitamin D 2000 units tablet Take 2 tablets (4,000 Units total) by mouth daily.       Sharen CounterLisa Leftwich-Kirby Certified Nurse-Midwife 07/29/2016 8:47 PM

## 2016-07-29 NOTE — Discharge Instructions (Signed)
Multiple Pregnancy Having a multiple pregnancy means that a woman is carrying more than one baby at a time. She may be pregnant with twins, triplets, or more. The majority of multiple pregnancies are twins. Naturally conceiving triplets or more (higher-order multiples) is rare. Multiple pregnancies are riskier than single pregnancies. A woman with a multiple pregnancy is more likely to have certain problems during her pregnancy. Therefore, she will need to have more frequent appointments for prenatal care. How does a multiple pregnancy happen? A multiple pregnancy happens when:  The woman's body releases more than one egg at a time, and then each egg gets fertilized by a different sperm.  This is the most common type of multiple pregnancy.  Twins or other multiples produced this way are fraternal. They are no more alike than non-multiple siblings are.  One sperm fertilizes one egg, which then divides into more than one embryo.  Twins or other multiples produced this way are identical. Identical multiples are always the same gender, and they look very much alike. Who is most likely to have a multiple pregnancy? A multiple pregnancy is more likely to develop in women who:  Have had fertility treatment, especially if the treatment included fertility drugs.  Are older than 25 years of age.  Have already had four or more children.  Have a family history of multiple pregnancy. How is a multiple pregnancy diagnosed? A multiple pregnancy may be diagnosed based on:  Symptoms such as:  Rapid weight gain in the first 3 months of pregnancy (first trimester).  More severe nausea and breast tenderness than what is typical of a single pregnancy.  The uterus measuring larger than what is normal for the stage of the pregnancy.  Blood tests that detect a higher-than-normal level of human chorionic gonadotropin (hCG). This is a hormone that your body produces in early pregnancy.  Ultrasound exam.  This is used to confirm that you are carrying multiples. What risks are associated with multiple pregnancy? A multiple pregnancy puts you at a higher risk for certain problems during or after your pregnancy, including:  Having your babies delivered before you have reached a full-term pregnancy (preterm birth). A full-term pregnancy lasts for at least 37 weeks. Babies born before 37 weeks may have a higher risk of a variety of health problems, such as breathing problems, feeding difficulties, cerebral palsy, and learning disabilities.  Diabetes.  Preeclampsia. This is a serious condition that causes high blood pressure along with other symptoms, such as swelling and headaches, during pregnancy.  Excessive blood loss after childbirth (postpartum hemorrhage).  Postpartum depression.  Low birth weight of the babies. How will having a multiple pregnancy affect my care? Your health care provider will want to monitor you more closely during your pregnancy to make sure that your babies are growing normally and that you are healthy. Follow these instructions at home: Because your pregnancy is considered to be high risk, you will need to work closely with your health care team. You may also need to make some lifestyle changes. These may include the following: Eating and drinking   Increase your nutrition.  Follow your health care provider's recommendations for weight gain. You may need to gain a little extra weight when you are pregnant with multiples.  Eat healthy snacks often throughout the day. This can add calories and reduce nausea.  Drink enough fluid to keep your urine clear or pale yellow.  Take prenatal vitamins. Activity  By 20-24 weeks, you may need to   limit your activities.  Avoid activities and work that take a lot of effort (are strenuous).  Ask your health care provider when you should stop having sexual intercourse.  Rest often. General instructions   Do not use any  products that contain nicotine or tobacco, such as cigarettes and e-cigarettes. If you need help quitting, ask your health care provider.  Do not drink alcohol or use illegal drugs.  Take over-the-counter and prescription medicines only as told by your health care provider.  Arrange for extra help around the house.  Keep all follow-up visits and all prenatal visits as told by your health care provider. This is important. Contact a health care provider if:  You have dizziness.  You have persistent nausea, vomiting, or diarrhea.  You are having trouble gaining weight.  You have feelings of depression or other emotions that are interfering with your normal activities. Get help right away if:  You have a fever.  You have pain with urination.  You have fluid leaking from your vagina.  You have a bad-smelling vaginal discharge.  You notice increased swelling in your face, hands, legs, or ankles.  You have spotting or bleeding from your vagina.  You have pelvic cramps, pelvic pressure, or nagging pain in your abdomen or lower back.  You are having regular contractions.  You develop a severe headache, with or without visual changes.  You have shortness of breath or chest pain.  You notice less fetal movement, or no fetal movement. This information is not intended to replace advice given to you by your health care provider. Make sure you discuss any questions you have with your health care provider. Document Released: 05/05/2008 Document Revised: 03/27/2016 Document Reviewed: 03/27/2016 Elsevier Interactive Patient Education  2017 [employer]  

## 2016-07-29 NOTE — MAU Note (Signed)
Has been having shortness of breath. Hard to catch her breath when she gets up.  Has been having palpations during preg, doesn't know if this is related.

## 2016-08-10 NOTE — L&D Delivery Note (Signed)
Delivery Note At  a viable female was delivered via  (Presentation:vtx ;ROA  ).  APGAR:9 ,9 ; weight 6-15  .   Placenta status:clamped , . 3V Cord:  with the following complications: After delivery of twin A  SVD revealed hand noted in as presenting part. Bedside US done and baby B noted to be in transverse position.  External version with pt consent tried x 2 with Dr. Sallye OberKulwa and Lubertha BasqueN Prothero.  Fetus returned to transverse.  Pt was consented to procedure with LTCS, BTL and left labial cyst removal.  See OP note for further information  Anesthesia:  Epideral Episiotomy:  None Lacerations:  None Suture Repair: None  Mom to OR.  Baby to Couplet care / Skin to Skin.  Henderson Newcomerancy Jean Prothero 09/12/2016, 10:29 AM

## 2016-08-26 LAB — OB RESULTS CONSOLE GBS: STREP GROUP B AG: POSITIVE

## 2016-09-08 ENCOUNTER — Encounter (HOSPITAL_COMMUNITY): Payer: Self-pay | Admitting: *Deleted

## 2016-09-08 ENCOUNTER — Telehealth (HOSPITAL_COMMUNITY): Payer: Self-pay | Admitting: *Deleted

## 2016-09-08 NOTE — Telephone Encounter (Signed)
Preadmission screen  

## 2016-09-10 ENCOUNTER — Other Ambulatory Visit: Payer: Self-pay | Admitting: Obstetrics & Gynecology

## 2016-09-12 ENCOUNTER — Inpatient Hospital Stay (HOSPITAL_COMMUNITY)
Admission: AD | Admit: 2016-09-12 | Discharge: 2016-09-15 | DRG: 765 | Disposition: A | Discharge status: Home / Self Care | Payer: BLUE CROSS/BLUE SHIELD | Source: Ambulatory Visit | Attending: Obstetrics & Gynecology | Admitting: Obstetrics & Gynecology

## 2016-09-12 ENCOUNTER — Inpatient Hospital Stay (HOSPITAL_COMMUNITY): Payer: BLUE CROSS/BLUE SHIELD | Admitting: Anesthesiology

## 2016-09-12 ENCOUNTER — Encounter (HOSPITAL_COMMUNITY): Payer: Self-pay | Admitting: *Deleted

## 2016-09-12 ENCOUNTER — Inpatient Hospital Stay (HOSPITAL_COMMUNITY): Payer: BLUE CROSS/BLUE SHIELD

## 2016-09-12 ENCOUNTER — Encounter (HOSPITAL_COMMUNITY)
Admission: AD | Disposition: A | Discharge status: Home / Self Care | Payer: Self-pay | Source: Ambulatory Visit | Attending: Obstetrics & Gynecology

## 2016-09-12 ENCOUNTER — Inpatient Hospital Stay (HOSPITAL_COMMUNITY): Admission: RE | Admit: 2016-09-12 | Payer: BLUE CROSS/BLUE SHIELD | Source: Ambulatory Visit

## 2016-09-12 DIAGNOSIS — Z833 Family history of diabetes mellitus: Secondary | ICD-10-CM

## 2016-09-12 DIAGNOSIS — F329 Major depressive disorder, single episode, unspecified: Secondary | ICD-10-CM | POA: Diagnosis present

## 2016-09-12 DIAGNOSIS — O99214 Obesity complicating childbirth: Secondary | ICD-10-CM | POA: Diagnosis present

## 2016-09-12 DIAGNOSIS — O30043 Twin pregnancy, dichorionic/diamniotic, third trimester: Secondary | ICD-10-CM | POA: Diagnosis present

## 2016-09-12 DIAGNOSIS — Z880 Allergy status to penicillin: Secondary | ICD-10-CM | POA: Diagnosis not present

## 2016-09-12 DIAGNOSIS — O99824 Streptococcus B carrier state complicating childbirth: Secondary | ICD-10-CM | POA: Diagnosis present

## 2016-09-12 DIAGNOSIS — Z6835 Body mass index (BMI) 35.0-35.9, adult: Secondary | ICD-10-CM

## 2016-09-12 DIAGNOSIS — O30049 Twin pregnancy, dichorionic/diamniotic, unspecified trimester: Secondary | ICD-10-CM

## 2016-09-12 DIAGNOSIS — O322XX2 Maternal care for transverse and oblique lie, fetus 2: Secondary | ICD-10-CM | POA: Diagnosis present

## 2016-09-12 DIAGNOSIS — F32A Depression, unspecified: Secondary | ICD-10-CM | POA: Diagnosis present

## 2016-09-12 DIAGNOSIS — Z3A38 38 weeks gestation of pregnancy: Secondary | ICD-10-CM | POA: Diagnosis not present

## 2016-09-12 DIAGNOSIS — Z302 Encounter for sterilization: Secondary | ICD-10-CM | POA: Diagnosis not present

## 2016-09-12 DIAGNOSIS — E669 Obesity, unspecified: Secondary | ICD-10-CM | POA: Diagnosis present

## 2016-09-12 DIAGNOSIS — N907 Vulvar cyst: Secondary | ICD-10-CM | POA: Diagnosis present

## 2016-09-12 DIAGNOSIS — B951 Streptococcus, group B, as the cause of diseases classified elsewhere: Secondary | ICD-10-CM | POA: Diagnosis present

## 2016-09-12 DIAGNOSIS — Z823 Family history of stroke: Secondary | ICD-10-CM

## 2016-09-12 DIAGNOSIS — Z3689 Encounter for other specified antenatal screening: Secondary | ICD-10-CM

## 2016-09-12 DIAGNOSIS — O99344 Other mental disorders complicating childbirth: Secondary | ICD-10-CM | POA: Diagnosis present

## 2016-09-12 HISTORY — PX: CYST EXCISION: SHX5701

## 2016-09-12 LAB — TYPE AND SCREEN
ABO/RH(D): AB POS
Antibody Screen: NEGATIVE

## 2016-09-12 LAB — CBC
HEMATOCRIT: 35.4 % — AB (ref 36.0–46.0)
HEMOGLOBIN: 12.3 g/dL (ref 12.0–15.0)
MCH: 30.6 pg (ref 26.0–34.0)
MCHC: 34.7 g/dL (ref 30.0–36.0)
MCV: 88.1 fL (ref 78.0–100.0)
Platelets: 151 10*3/uL (ref 150–400)
RBC: 4.02 MIL/uL (ref 3.87–5.11)
RDW: 13.6 % (ref 11.5–15.5)
WBC: 7.6 10*3/uL (ref 4.0–10.5)

## 2016-09-12 LAB — RPR: RPR: NONREACTIVE

## 2016-09-12 SURGERY — Surgical Case
Anesthesia: Epidural | Site: Perineum

## 2016-09-12 MED ORDER — SIMETHICONE 80 MG PO CHEW
80.0000 mg | CHEWABLE_TABLET | ORAL | Status: DC | PRN
  Filled 2016-09-12: qty 1

## 2016-09-12 MED ORDER — ONDANSETRON HCL 4 MG/2ML IJ SOLN: 4.0000 mg | Freq: Three times a day (TID) | INTRAMUSCULAR | Status: DC | PRN

## 2016-09-12 MED ORDER — ZOLPIDEM TARTRATE 5 MG PO TABS: 5.0000 mg | ORAL_TABLET | Freq: Every evening | ORAL | Status: DC | PRN

## 2016-09-12 MED ORDER — ONDANSETRON HCL 4 MG/2ML IJ SOLN: 4.0000 mg | Freq: Four times a day (QID) | INTRAMUSCULAR | Status: DC | PRN

## 2016-09-12 MED ORDER — DIPHENHYDRAMINE HCL 50 MG/ML IJ SOLN: 12.5000 mg | INTRAMUSCULAR | Status: DC | PRN

## 2016-09-12 MED ORDER — FENTANYL CITRATE (PF) 100 MCG/2ML IJ SOLN
INTRAMUSCULAR | Status: AC
  Filled 2016-09-12: qty 2

## 2016-09-12 MED ORDER — SODIUM BICARBONATE 8.4 % IV SOLN
INTRAVENOUS | Status: DC | PRN
  Administered 2016-09-12 (×2): 5 mL via EPIDURAL

## 2016-09-12 MED ORDER — OXYTOCIN 10 UNIT/ML IJ SOLN
INTRAVENOUS | Status: DC | PRN
  Administered 2016-09-12: 40 [IU] via INTRAVENOUS

## 2016-09-12 MED ORDER — KETOROLAC TROMETHAMINE 30 MG/ML IJ SOLN
30.0000 mg | Freq: Once | INTRAMUSCULAR | Status: AC
  Administered 2016-09-12: 30 mg via INTRAMUSCULAR

## 2016-09-12 MED ORDER — PHENYLEPHRINE 40 MCG/ML (10ML) SYRINGE FOR IV PUSH (FOR BLOOD PRESSURE SUPPORT): 80.0000 ug | PREFILLED_SYRINGE | INTRAVENOUS | Status: DC | PRN

## 2016-09-12 MED ORDER — DIBUCAINE 1 % RE OINT
1.0000 "application " | TOPICAL_OINTMENT | RECTAL | Status: DC | PRN
  Filled 2016-09-12: qty 28

## 2016-09-12 MED ORDER — CEFAZOLIN SODIUM-DEXTROSE 2-4 GM/100ML-% IV SOLN
2.0000 g | Freq: Once | INTRAVENOUS | Status: AC
  Administered 2016-09-12: 2 g via INTRAVENOUS

## 2016-09-12 MED ORDER — FLEET ENEMA 7-19 GM/118ML RE ENEM: 1.0000 | ENEMA | RECTAL | Status: DC | PRN

## 2016-09-12 MED ORDER — NALOXONE HCL 0.4 MG/ML IJ SOLN: 0.4000 mg | INTRAMUSCULAR | Status: DC | PRN

## 2016-09-12 MED ORDER — NALOXONE HCL 2 MG/2ML IJ SOSY
1.0000 ug/kg/h | PREFILLED_SYRINGE | INTRAVENOUS | Status: DC | PRN
  Filled 2016-09-12: qty 2

## 2016-09-12 MED ORDER — NALBUPHINE HCL 10 MG/ML IJ SOLN: 5.0000 mg | INTRAMUSCULAR | Status: DC | PRN

## 2016-09-12 MED ORDER — LACTATED RINGERS IV SOLN
INTRAVENOUS | Status: DC | PRN
  Administered 2016-09-12: 19:00:00
  Administered 2016-09-12: 09:00:00 via INTRAVENOUS

## 2016-09-12 MED ORDER — MENTHOL 3 MG MT LOZG
1.0000 | LOZENGE | OROMUCOSAL | Status: DC | PRN
  Filled 2016-09-12: qty 9

## 2016-09-12 MED ORDER — LACTATED RINGERS IV SOLN
INTRAVENOUS | Status: DC
  Administered 2016-09-12: 02:00:00 via INTRAVENOUS

## 2016-09-12 MED ORDER — KETOROLAC TROMETHAMINE 30 MG/ML IJ SOLN: 30.0000 mg | Freq: Four times a day (QID) | INTRAMUSCULAR | Status: AC | PRN

## 2016-09-12 MED ORDER — CEFAZOLIN SODIUM-DEXTROSE 2-4 GM/100ML-% IV SOLN
INTRAVENOUS | Status: AC
  Filled 2016-09-12: qty 100

## 2016-09-12 MED ORDER — FENTANYL 2.5 MCG/ML BUPIVACAINE 1/10 % EPIDURAL INFUSION (WH - ANES)
14.0000 mL/h | INTRAMUSCULAR | Status: DC | PRN
  Administered 2016-09-12: 13 mL/h via EPIDURAL

## 2016-09-12 MED ORDER — TERBUTALINE SULFATE 1 MG/ML IJ SOLN: 0.2500 mg | Freq: Once | INTRAMUSCULAR | Status: DC | PRN

## 2016-09-12 MED ORDER — IBUPROFEN 600 MG PO TABS
600.0000 mg | ORAL_TABLET | Freq: Four times a day (QID) | ORAL | Status: DC
  Administered 2016-09-13 – 2016-09-15 (×11): 600 mg via ORAL
  Filled 2016-09-12 (×11): qty 1

## 2016-09-12 MED ORDER — FENTANYL 2.5 MCG/ML BUPIVACAINE 1/10 % EPIDURAL INFUSION (WH - ANES)
INTRAMUSCULAR | Status: AC
  Filled 2016-09-12: qty 100

## 2016-09-12 MED ORDER — DIPHENHYDRAMINE HCL 25 MG PO CAPS
25.0000 mg | ORAL_CAPSULE | ORAL | Status: DC | PRN
  Administered 2016-09-13: 25 mg via ORAL
  Filled 2016-09-12: qty 1

## 2016-09-12 MED ORDER — SIMETHICONE 80 MG PO CHEW
80.0000 mg | CHEWABLE_TABLET | Freq: Three times a day (TID) | ORAL | Status: DC
  Administered 2016-09-13 – 2016-09-15 (×5): 80 mg via ORAL
  Filled 2016-09-12 (×10): qty 1

## 2016-09-12 MED ORDER — OXYTOCIN 10 UNIT/ML IJ SOLN
INTRAMUSCULAR | Status: AC
  Filled 2016-09-12: qty 4

## 2016-09-12 MED ORDER — PHENYLEPHRINE 40 MCG/ML (10ML) SYRINGE FOR IV PUSH (FOR BLOOD PRESSURE SUPPORT)
PREFILLED_SYRINGE | INTRAVENOUS | Status: AC
  Filled 2016-09-12: qty 20

## 2016-09-12 MED ORDER — EPHEDRINE 5 MG/ML INJ: 10.0000 mg | INTRAVENOUS | Status: DC | PRN

## 2016-09-12 MED ORDER — MISOPROSTOL 25 MCG QUARTER TABLET
25.0000 ug | ORAL_TABLET | ORAL | Status: DC | PRN
  Filled 2016-09-12: qty 0.25

## 2016-09-12 MED ORDER — PHENYLEPHRINE HCL 10 MG/ML IJ SOLN
INTRAMUSCULAR | Status: DC | PRN
  Administered 2016-09-12: 80 ug via INTRAVENOUS

## 2016-09-12 MED ORDER — SODIUM CHLORIDE 0.9% FLUSH: 3.0000 mL | INTRAVENOUS | Status: DC | PRN

## 2016-09-12 MED ORDER — KETOROLAC TROMETHAMINE 30 MG/ML IJ SOLN: 30.0000 mg | Freq: Once | INTRAMUSCULAR | Status: AC

## 2016-09-12 MED ORDER — LACTATED RINGERS IV SOLN: 500.0000 mL | INTRAVENOUS | Status: DC | PRN

## 2016-09-12 MED ORDER — OXYTOCIN 40 UNITS IN LACTATED RINGERS INFUSION - SIMPLE MED
1.0000 m[IU]/min | INTRAVENOUS | Status: DC
  Administered 2016-09-12: 2 m[IU]/min via INTRAVENOUS

## 2016-09-12 MED ORDER — SODIUM BICARBONATE 8.4 % IV SOLN
INTRAVENOUS | Status: AC
  Filled 2016-09-12: qty 50

## 2016-09-12 MED ORDER — COCONUT OIL OIL
1.0000 "application " | TOPICAL_OIL | Status: DC | PRN
  Filled 2016-09-12: qty 120

## 2016-09-12 MED ORDER — ACETAMINOPHEN 325 MG PO TABS: 650.0000 mg | ORAL_TABLET | ORAL | Status: DC | PRN

## 2016-09-12 MED ORDER — ONDANSETRON HCL 4 MG/2ML IJ SOLN
INTRAMUSCULAR | Status: DC | PRN
  Administered 2016-09-12: 4 mg via INTRAVENOUS

## 2016-09-12 MED ORDER — FENTANYL CITRATE (PF) 100 MCG/2ML IJ SOLN: 50.0000 ug | INTRAMUSCULAR | Status: DC | PRN

## 2016-09-12 MED ORDER — CEFAZOLIN SODIUM-DEXTROSE 2-4 GM/100ML-% IV SOLN
2.0000 g | INTRAVENOUS | Status: AC
  Administered 2016-09-12: 2 g via INTRAVENOUS

## 2016-09-12 MED ORDER — FENTANYL CITRATE (PF) 100 MCG/2ML IJ SOLN
INTRAMUSCULAR | Status: DC | PRN
  Administered 2016-09-12: 50 ug via INTRAVENOUS
  Administered 2016-09-12 (×2): 25 ug via INTRAVENOUS

## 2016-09-12 MED ORDER — LIDOCAINE HCL (PF) 1 % IJ SOLN
INTRAMUSCULAR | Status: DC | PRN
  Administered 2016-09-12 (×2): 4 mL via EPIDURAL

## 2016-09-12 MED ORDER — TETANUS-DIPHTH-ACELL PERTUSSIS 5-2.5-18.5 LF-MCG/0.5 IM SUSP
0.5000 mL | Freq: Once | INTRAMUSCULAR | Status: DC
  Filled 2016-09-12: qty 0.5

## 2016-09-12 MED ORDER — OXYTOCIN 40 UNITS IN LACTATED RINGERS INFUSION - SIMPLE MED: 2.5000 [IU]/h | INTRAVENOUS | Status: AC

## 2016-09-12 MED ORDER — MORPHINE SULFATE (PF) 0.5 MG/ML IJ SOLN
INTRAMUSCULAR | Status: AC
  Filled 2016-09-12: qty 10

## 2016-09-12 MED ORDER — SOD CITRATE-CITRIC ACID 500-334 MG/5ML PO SOLN
30.0000 mL | ORAL | Status: DC | PRN
  Administered 2016-09-12: 30 mL via ORAL
  Filled 2016-09-12 (×2): qty 15

## 2016-09-12 MED ORDER — OXYTOCIN 40 UNITS IN LACTATED RINGERS INFUSION - SIMPLE MED
2.5000 [IU]/h | INTRAVENOUS | Status: DC
  Filled 2016-09-12: qty 1000

## 2016-09-12 MED ORDER — KETOROLAC TROMETHAMINE 30 MG/ML IJ SOLN
INTRAMUSCULAR | Status: AC
  Filled 2016-09-12: qty 1

## 2016-09-12 MED ORDER — PHENYLEPHRINE 40 MCG/ML (10ML) SYRINGE FOR IV PUSH (FOR BLOOD PRESSURE SUPPORT)
PREFILLED_SYRINGE | INTRAVENOUS | Status: AC
  Filled 2016-09-12: qty 10

## 2016-09-12 MED ORDER — LACTATED RINGERS IV SOLN
INTRAVENOUS | Status: DC | PRN
  Administered 2016-09-12 (×3): via INTRAVENOUS

## 2016-09-12 MED ORDER — OXYTOCIN BOLUS FROM INFUSION: 500.0000 mL | Freq: Once | INTRAVENOUS | Status: DC

## 2016-09-12 MED ORDER — MEPERIDINE HCL 25 MG/ML IJ SOLN
INTRAMUSCULAR | Status: DC | PRN
  Administered 2016-09-12: 12.5 mg via INTRAVENOUS

## 2016-09-12 MED ORDER — DIPHENHYDRAMINE HCL 50 MG/ML IJ SOLN
12.5000 mg | INTRAMUSCULAR | Status: DC | PRN
  Administered 2016-09-12: 12.5 mg via INTRAVENOUS
  Filled 2016-09-12: qty 1

## 2016-09-12 MED ORDER — ONDANSETRON HCL 4 MG/2ML IJ SOLN
INTRAMUSCULAR | Status: AC
  Filled 2016-09-12: qty 2

## 2016-09-12 MED ORDER — LACTATED RINGERS IV SOLN
INTRAVENOUS | Status: DC
  Administered 2016-09-12: 19:00:00 via INTRAVENOUS

## 2016-09-12 MED ORDER — LIDOCAINE HCL (PF) 1 % IJ SOLN
30.0000 mL | INTRAMUSCULAR | Status: DC | PRN
  Filled 2016-09-12: qty 30

## 2016-09-12 MED ORDER — NALBUPHINE HCL 10 MG/ML IJ SOLN: 5.0000 mg | Freq: Once | INTRAMUSCULAR | Status: DC | PRN

## 2016-09-12 MED ORDER — SIMETHICONE 80 MG PO CHEW
80.0000 mg | CHEWABLE_TABLET | ORAL | Status: DC
  Administered 2016-09-13 – 2016-09-14 (×3): 80 mg via ORAL
  Filled 2016-09-12 (×5): qty 1

## 2016-09-12 MED ORDER — MEPERIDINE HCL 25 MG/ML IJ SOLN
INTRAMUSCULAR | Status: AC
  Filled 2016-09-12: qty 1

## 2016-09-12 MED ORDER — LACTATED RINGERS IV SOLN
500.0000 mL | Freq: Once | INTRAVENOUS | Status: AC
  Administered 2016-09-12: 500 mL via INTRAVENOUS

## 2016-09-12 MED ORDER — SENNOSIDES-DOCUSATE SODIUM 8.6-50 MG PO TABS
2.0000 | ORAL_TABLET | ORAL | Status: DC
  Administered 2016-09-13 – 2016-09-14 (×3): 2 via ORAL
  Filled 2016-09-12 (×5): qty 2

## 2016-09-12 MED ORDER — PRENATAL MULTIVITAMIN CH
1.0000 | ORAL_TABLET | Freq: Every day | ORAL | Status: DC
  Administered 2016-09-14 – 2016-09-15 (×2): 1 via ORAL
  Filled 2016-09-12 (×4): qty 1

## 2016-09-12 MED ORDER — ACETAMINOPHEN 500 MG PO TABS
1000.0000 mg | ORAL_TABLET | Freq: Four times a day (QID) | ORAL | Status: AC
  Administered 2016-09-12 – 2016-09-13 (×4): 1000 mg via ORAL
  Filled 2016-09-12 (×4): qty 2

## 2016-09-12 MED ORDER — ACETAMINOPHEN 325 MG PO TABS
650.0000 mg | ORAL_TABLET | ORAL | Status: DC | PRN
  Administered 2016-09-13: 650 mg via ORAL
  Filled 2016-09-12 (×2): qty 2

## 2016-09-12 MED ORDER — CEFAZOLIN IN D5W 1 GM/50ML IV SOLN
1.0000 g | Freq: Three times a day (TID) | INTRAVENOUS | Status: DC
  Filled 2016-09-12: qty 50

## 2016-09-12 MED ORDER — SODIUM CHLORIDE 0.9 % IR SOLN
Status: DC | PRN
Start: 2016-09-12 — End: 2016-09-12
  Administered 2016-09-12: 300 mL

## 2016-09-12 MED ORDER — WITCH HAZEL-GLYCERIN EX PADS: 1.0000 "application " | MEDICATED_PAD | CUTANEOUS | Status: DC | PRN

## 2016-09-12 MED ORDER — LIDOCAINE-EPINEPHRINE (PF) 2 %-1:200000 IJ SOLN
INTRAMUSCULAR | Status: AC
  Filled 2016-09-12: qty 20

## 2016-09-12 MED ORDER — DIPHENHYDRAMINE HCL 25 MG PO CAPS
25.0000 mg | ORAL_CAPSULE | Freq: Four times a day (QID) | ORAL | Status: DC | PRN
  Filled 2016-09-12: qty 1

## 2016-09-12 MED ORDER — MORPHINE SULFATE (PF) 0.5 MG/ML IJ SOLN
INTRAMUSCULAR | Status: DC | PRN
Start: 2016-09-12 — End: 2016-09-12
  Administered 2016-09-12: 2 mg via INTRAVENOUS
  Administered 2016-09-12: 3 mg via EPIDURAL

## 2016-09-12 SURGICAL SUPPLY — 38 items
ADH SKN CLS APL DERMABOND .7 (GAUZE/BANDAGES/DRESSINGS)
CHLORAPREP W/TINT 26ML (MISCELLANEOUS) ×4 IMPLANT
CLAMP CORD UMBIL (MISCELLANEOUS) IMPLANT
CLOTH BEACON ORANGE TIMEOUT ST (SAFETY) ×4 IMPLANT
DERMABOND ADVANCED (GAUZE/BANDAGES/DRESSINGS)
DERMABOND ADVANCED .7 DNX12 (GAUZE/BANDAGES/DRESSINGS) IMPLANT
DRAPE C SECTION CLR SCREEN (DRAPES) ×4 IMPLANT
DRSG OPSITE POSTOP 4X10 (GAUZE/BANDAGES/DRESSINGS) ×4 IMPLANT
ELECT REM PT RETURN 9FT ADLT (ELECTROSURGICAL) ×4
ELECTRODE REM PT RTRN 9FT ADLT (ELECTROSURGICAL) ×2 IMPLANT
EXTRACTOR VACUUM M CUP 4 TUBE (SUCTIONS) IMPLANT
EXTRACTOR VACUUM M CUP 4' TUBE (SUCTIONS)
GLOVE BIOGEL PI IND STRL 7.0 (GLOVE) ×4 IMPLANT
GLOVE BIOGEL PI INDICATOR 7.0 (GLOVE) ×4
GLOVE SURG SS PI 6.5 STRL IVOR (GLOVE) ×4 IMPLANT
GOWN STRL REUS W/TWL LRG LVL3 (GOWN DISPOSABLE) ×8 IMPLANT
KIT ABG SYR 3ML LUER SLIP (SYRINGE) IMPLANT
LIQUID BAND (GAUZE/BANDAGES/DRESSINGS) ×2 IMPLANT
NDL HYPO 25X5/8 SAFETYGLIDE (NEEDLE) IMPLANT
NEEDLE HYPO 25X5/8 SAFETYGLIDE (NEEDLE) IMPLANT
NS IRRIG 1000ML POUR BTL (IV SOLUTION) ×4 IMPLANT
PACK C SECTION WH (CUSTOM PROCEDURE TRAY) ×4 IMPLANT
PAD OB MATERNITY 4.3X12.25 (PERSONAL CARE ITEMS) ×4 IMPLANT
PENCIL SMOKE EVAC W/HOLSTER (ELECTROSURGICAL) ×4 IMPLANT
RTRCTR C-SECT PINK 25CM LRG (MISCELLANEOUS) ×4 IMPLANT
SUT CHROMIC 1 CTX 36 (SUTURE) IMPLANT
SUT CHROMIC 2 0 CT 1 (SUTURE) ×4 IMPLANT
SUT MON AB 4-0 PS1 27 (SUTURE) ×4 IMPLANT
SUT PLAIN 1 NONE 54 (SUTURE) IMPLANT
SUT PLAIN 2 0 (SUTURE)
SUT PLAIN 2 0 XLH (SUTURE) IMPLANT
SUT PLAIN ABS 2-0 CT1 27XMFL (SUTURE) IMPLANT
SUT VIC AB 0 CTX 36 (SUTURE) ×4
SUT VIC AB 0 CTX36XBRD ANBCTRL (SUTURE) ×2 IMPLANT
SUT VIC AB 1 CTX 36 (SUTURE) ×8
SUT VIC AB 1 CTX36XBRD ANBCTRL (SUTURE) ×4 IMPLANT
TOWEL OR 17X24 6PK STRL BLUE (TOWEL DISPOSABLE) ×4 IMPLANT
TRAY FOLEY CATH SILVER 14FR (SET/KITS/TRAYS/PACK) ×4 IMPLANT

## 2016-09-12 NOTE — Lactation Note (Addendum)
This note was copied from a baby's chart. Lactation Consultation Note  Patient Name: Leslie Su MonksBrandi Alcott WUJWJ'XToday's Date: 09/12/2016 Reason for consult: Initial assessment;Multiple gestation   Initial consult with mom of 8 hour old twins. Mom reports she BF her 89 yo for 3 weeks and then switched to formula as she had low milk supply.  Mom is bed with infant STS, infants asleep currently. Enc mom to feed infants STS 8-12 x in 24 hours at first feeding cues. Mom reports she BF them together in the football hold and that worked well. Enc her to use lots of pillow support with feeding. Did not show hand expression as room full of visitors. Mom has feeding logs. Enc mom to call out to desk for feeding assistance as needed.  BF Resources and [facility] Brochure given, mom informed of IP/OP Services, BF Support Groups and LC phone #. Mom reports her insurance company provides breast pumps, she is to call and order.   Mom reports no questions/concerns at this time.    Maternal Data Formula Feeding for Exclusion: Yes Reason for exclusion: Mother's choice to formula and breast feed on admission Does the patient have breastfeeding experience prior to this delivery?: Yes  Feeding    LATCH Score/Interventions                      Lactation Tools Discussed/Used WIC Program: No   Consult Status Consult Status: Follow-up Date: 09/13/16 Follow-up type: In-patient    Silas FloodSharon S Cadotte Aultman 09/12/2016, 4:51 PM

## 2016-09-12 NOTE — Anesthesia Procedure Notes (Signed)
Epidural Patient location during procedure: OB Start time: 09/12/2016 3:36 AM  Staffing Anesthesiologist: Mal AmabileFOSTER, Saidy Ormand Performed: anesthesiologist   Preanesthetic Checklist Completed: patient identified, site marked, surgical consent, pre-op evaluation, timeout performed, IV checked, risks and benefits discussed and monitors and equipment checked  Epidural Patient position: sitting Prep: site prepped and draped and DuraPrep Patient monitoring: continuous pulse ox and blood pressure Approach: midline Location: L4-L5 Injection technique: LOR air  Needle:  Needle type: Tuohy  Needle gauge: 17 G Needle length: 9 cm and 9 Needle insertion depth: 5 cm cm Catheter type: closed end flexible Catheter size: 19 Gauge Catheter at skin depth: 10 cm Test dose: negative and Other  Assessment Events: blood not aspirated, injection not painful, no injection resistance, negative IV test and no paresthesia  Additional Notes Patient identified. Risks and benefits discussed including failed block, incomplete  Pain control, post dural puncture headache, nerve damage, paralysis, blood pressure Changes, nausea, vomiting, reactions to medications-both toxic and allergic and post Partum back pain. All questions were answered. Patient expressed understanding and wished to proceed. Sterile technique was used throughout procedure. Epidural site was Dressed with sterile barrier dressing. No paresthesias, signs of intravascular injection Or signs of intrathecal spread were encountered.  Patient was more comfortable after the epidural was dosed. Please see RN's note for documentation of vital signs and FHR which are stable.

## 2016-09-12 NOTE — H&P (Signed)
Leslie Ayers is a 26 y.o. female, G4P1021 at 38.3 weeks, presenting for IOL s/t Twin Gestation.  Patient with Di/Di twins and pregnancy has been overall unremarkable with exception as listed below.  Patient is under the care of [address]   Patient Active Problem List   Diagnosis Date Noted  . Indication for care or intervention in labor or delivery 09/12/2016  . Twin gestation, dichorionic diamniotic 09/12/2016  . Group beta Strep positive 09/12/2016  . Penicillin allergy 09/12/2016  . Depressive disorder 09/12/2016  . Syncope 07/07/2016    History of present pregnancy: Patient entered care at 12 weeks.   EDC of 09/23/2015 was established by Definite LMP of 12/11/2015.   Anatomy scan:  20.2 weeks, with normal findings and an anterior and posterior placenta.    Significant prenatal events: 1st Trimester: Syncope episodes 2nd Trimester: C/O dizziness and referred to cardiologist for h/o palpitations.  Pap abnormal-LGSIL with +HPV-colpo performed.  3rd Trimester:   Desires BTL Last evaluation:  09/07/2016 by N. Dillard, MD.  Vtx/Vtx.  Both twins 7lbs 3 oz.   OB History    Gravida Para Term Preterm AB Living   4 1 1   2 1    SAB TAB Ectopic Multiple Live Births   2 0     1    11/2006: SAB 03/2010: SVD, Female, 7lbs 13 oz 01/2013: SAB   Past Medical History:  Diagnosis Date  . Anal fissure   . History of echocardiogram    Echo 11/17: EF 60-65, no RWMA  . Hx of chlamydia infection    Past Surgical History:  Procedure Laterality Date  . 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 Social History:  reports that she has never smoked. She has never used smokeless tobacco. She reports that she drinks alcohol. She reports that she does not use  drugs.   Prenatal Transfer Tool  Maternal Diabetes: No Genetic Screening: Normal Maternal Ultrasounds/Referrals: Normal Fetal Ultrasounds or other Referrals:  None Maternal Substance Abuse:  No Significant Maternal Medications:  None Significant Maternal Lab Results: Lab values include: Group B Strep positive    ROS:  +Rectal Pressure, -Perception of Ctx, -VB, +FM No recent illness or GI concerns/upset Patient denies HA, visual disturbances, and epigastric pain  Allergies  Allergen Reactions  . Penicillins Rash    Has patient had a Has patient had a PCN reaction that required hospitalization No Has patient had a PCN reaction occurring within the last 10 years: No   PCN reaction causing immediate rash, facial/tongue/throat swelling, SOB or lightheadedness with hypotension: No Has patient had a PCN reaction causing severe rash involving mucus membranes or skin necrosis: No If all of the above answers are "NO", then may proceed with Cephalosporin use.        Last menstrual period 06/21/2015, unknown if currently breastfeeding.  Physical Exam  Constitutional: She is oriented to person, place, and time. She appears well-developed and well-nourished. No distress.  HENT:  Head: Normocephalic and atraumatic.  Eyes: Conjunctivae are normal.  Neck: Normal range of motion.  Cardiovascular: Normal rate, regular rhythm and normal heart sounds.   Respiratory: Effort normal and breath sounds normal.  GI: Soft. Bowel sounds are normal.  Gravid--fundal height appears appropriate, Soft RT, NT  Genitourinary:  Genitourinary Comments: Left Labial Cysts  Musculoskeletal: Normal range of motion. She exhibits no edema.  Neurological: She is alert and oriented to person, place, and time.  Skin: Skin is warm and dry.    Leopolds: EFW: 7lbs Presentation:Vertex, Vertex by US  FHR A: 155 bpm, Mod Var, -Decels, +Accels FHR B: 140 bpm, Mod Var, -Decels, +Accels UCs:  Q1-773min, palpates  mild  Prenatal labs: ABO, Rh: AB/Positive/-- (08/03 0000) Antibody: Negative (08/03 0000) Rubella:  Immune RPR: Nonreactive (08/03 0000)  HBsAg: Negative (08/03 0000)  HIV: Non-reactive (08/03 0000)  GBS: Positive (01/17 0000) Sickle cell/Hgb electrophoresis:  Normal Pap:  Abnormal-LGSIL GC:  NR Chlamydia:  NR Other:  TSH-Normal    Assessment IUP at 38.3wks Cat I FT x 2 Twin Gestation Depressive Disorder GBS Positive  [facility]: [facility] consult with Dr. Alinda SierrasAR Routine Labor and Delivery Orders per [facility] Protocol Confirm Vertex Vertex by Ultrasound per Dr. Alinda SierrasAR In room to complete assessment and discuss POC: -Discussed r/b of induction including fetal distress, serial induction, pain, and increased risk of c/s delivery -Discussed induction methods including cervical ripening agents, foley bulbs, and pitocin -Patient verbalizes understanding and wishes to proceed with induction process -VE reveals advanced dilation of 5/50/0 -AROM performed with initiation of pitocin -Ancef for GBS positive status -Okay for epidural as desired Dr.AR updated   Cherre RobinsJessica L Tenzin Edelman CNM, MSN 09/12/2016, 1:10 AM

## 2016-09-12 NOTE — Brief Op Note (Signed)
09/12/2016  10:50 AM  PATIENT:  Christy SartoriusBrandi N Schlafer  26 y.o. female  PRE-OPERATIVE DIAGNOSIS:  primary cesarean section for twin B, bi-lateral tubal ligation and drainage and resection of left labial cyst abscess.   POST-OPERATIVE DIAGNOSIS:  primary cesarean section for twin B, bi-lateral tubal ligation and drainage and resection of left labial cyst abscess.   PROCEDURE:  Procedure(s): CESAREAN SECTION (N/A) resection of left labial cyst (Left)  SURGEON:  Surgeon(s) and Role: Panel 1:    * Hoover BrownsEma Loma [place], MD - Primary  ASSISTANTS: Bernerd PhoNancy Prothero, CNM.    ANESTHESIA:   epidural  EBL:  Total I/O In: 2400 [I.V.:2400] Out: 1950 [Urine:750; Blood:1200]  BLOOD ADMINISTERED:none  DRAINS: none   LOCAL MEDICATIONS USED:  NONE  SPECIMEN:  Source of Specimen:  Placentas X 2, umbilical cord blood X 2.   DISPOSITION OF SPECIMEN:  PATHOLOGY  COUNTS:  YES  TOURNIQUET:  * No tourniquets in log *  DICTATION: .Note written in EPIC  PLAN OF CARE: Admit to inpatient   PATIENT DISPOSITION:  PACU - hemodynamically stable.   Delay start of Pharmacological VTE agent (>24hrs) due to surgical blood loss or risk of bleeding: not applicable

## 2016-09-12 NOTE — Op Note (Signed)
Patient: Leslie Ayers, Leslie Ayers DOB:  07-15-1991 MRN:  [id number]  DATE OF SURGERY: 09/12/2016  PREOP DIAGNOSIS:  1. 38 week 4 day EGA intrauterine di di twin pregnancy  2. Vaginal delivery of twin A  3. Unstable lie of twin B,  Transverse position    4. Desires permanent  Sterilization   5. Uncomfortable left labial cyst        POSTOP DIAGNOSIS: Same as above.  PROCEDURES:   1.  Primary low uterine segment transverse cesarean section via Pfannenstiel incision.  2.  Bilateral salpingectomy. 3.  Left labial cyst drainage and resection.      SURGEON: Dr.  Hoover Browns  ASSISTANT: Bernerd Pho, CNM   ANESTHESIA: Epidural  COMPLICATIONS: None  FINDINGS: Viable female infant in cephalic presentation, loose nuchal cord, ROA position, weight pending, Apgar scores of 8 and 9. Normal uterus and fallopian tubes and ovaries bilaterally. 4 x 4 cm left labial cyst.   EBL:  1000 cc  IV FLUID:  2300 cc LR   URINE OUTPUT: 300 cc clear urine  INDICATIONS: 26 y/o P1021 who presented for labor induction for di- di twin pregnancy in cephalic- cephalic presentation at 38 weeks 4 days EGA.  She proceeded to deliver twin A vaginally but then twin B ceased to be in cephalic presentation.  A bedside ultrasound showed twin B in transverse presentation, and high in the uterus.  External cephalic version was performed with fetus staying in the cephalic position temporarily then reverting back to transverse position.  Fetal scalp electrode was placed for better fetal monitoring with reassuring fetal heart beat through out the time.  Cervix was noted to be about 5cm dilated.  External version was performed about three times with temporary cephalic presentation and restitution to transverse positioning.  She was consented for the cesarean section for unstable lie of twin B after explaining risks benefits and alternatives of the procedure including but not limited to heavy bleeding, infection, damage to organs.    She  also desired complete removal of both tubes for permanent sterilization.  We explained risks, benefits and alternatives of tubal ligation.  We discussed risks, benefits and alternatives of postpartum tubal ligation including but not limited to risks of bleeding, infection and damage to organs.  She also understood the risk of tubal regret but she states she is 100% sure she did not want any more children.  We discussed that complete removal of both tubes will ensure complete sterilization but as with every procedure it is not 100% guaranteed.  She understood there were other kinds of birth control such as pills, patches, IUDs, vaginal rings and depo provera which were temporary but she did not desire them.  She understood there was also an option of female sterilization but she did not desire that option either.  She did not desire partial salpingectomy or tubal fulguration or placement of tubal clips or Essure sterilization.  All her questions were answered and she was consented for the procedure.  She expressed understanding that if she does have irregular or heavy bleeding after the sterilization she may require hormone control in the form of birth control devices or medication to regulate her cycles.    Patient also had a left labial cyst in the left side that was bothering her and she desired removal of the cyst.  We discussed risks of heavy bleeding, infection and damage to organs.       PROCEDURE:  She was taken to the operating room where her  epidural anesthesia was found to be adequate.  Ultrasound done in OR before procedure start showed transverse presentation of fetus.   She was prepped and draped in the usual sterile fashion and a Foley catheter was placed. She received 2 g of IV Ancef preoperatively. A Pfannenstiel incision was made with the scalpel and the incision extended through the subcutaneous layer and also the fascia with the bovie. Small perforators in the subcutaneous layer were  contained with the Bovie. The fascia was nicked in the midline and then was further separated from the rectus muscles bilaterally using Mayo scissors. Kochers were placed inferiorly and then superiorly to allow further separation of fascia from the rectus muscles.  The peritoneal cavity was entered bluntly with the fingers. The Alexis retractor was placed in. The bladder flap was created using Metzenbaum scissors.   The uterus was incised with a scalpel and the incision extended bluntly bilaterally with fingers. Clear amniotic fluid was noted.  Placenta for twin A then Baby's head was encountered upon uterine entry and this was delivered.  Loose nuchal  Cord was reduced then the rest of the body was delivered atraumatically.  She delivered a viable female infant, apgar scores 8, 9.  The cord was clamped and cut. Cord blood was collected.    The placenta was delivered with gentle traction on the umbilical cord. The edges of the uterus was grasped with clamps.  The uterus was cleared of clots and debris with a lap.  The uterus was exteriorized.  The uterine incision was closed with #1 Vicryl in a running locked stitch. Good hemostasis was noted over incision.  Attention was turned to the adnexa where above was noted.    The right  fallopian tube was identified and followed up to the fimbria end.    A kelly clamp was placed on the mesosalpinx below the tube and another one below it, and the region between the clamps bovied to isolate the tube from the mesosalpinx.  The clampled mesosalpinx pedicle was sutured with 2-0 vicryl below the clamp.   These steps were successively done until the whole tube was isolated up to the cornual end.    A similar procedure was done to remove the left fallopian tube.  Both uterine cornua and remaining mesosalpinx were hemostatic.    Uterus was returned to the abdomen.  Uterine incision was reinforced at the bilateral edges to enhance hemostasis.   Irrigation was applied and  suctioned.  Excellent hemostasis was noted.         The muscles were then reapproximated using chromic suture in interrupted stitches.  Fascia was closed using 0 Vicryl in a running stitch. The subcutaneous layer was irrigated and suctioned out. Small perforators were contained with the bovie.  The subcutaneous was closed over using 1-0 plain in interrupted stitches. The skin was closed using 4-0 Monocryl. Dermabond was applied. Honeycomb was then applied.   Attention was then turned to the vaginal area where a 4 X 4 cm left labial cyst was identified.  Region was cleaned with betadine.  Cyst was incised with pus drainage noted.  Cultures were drawn.  The abscess cyst wall was resected.  Hemostasis enhanced with bovie.  Cyst wall then closed with 2-0 vicryl in a running stitch.     The patient was then cleaned and she was taken to the recovery room in stable condition. The neonate was transported with the mother in stable condition.   SPECIMEN: Placentas X 2, Umbilical cord  blood X 2 .   DISPOSITION: TO PACU, STABLE.    Hoover Browns, MD.

## 2016-09-12 NOTE — Anesthesia Postprocedure Evaluation (Signed)
Anesthesia Post Note  Patient: Leslie Ayers  Procedure(s) Performed: Procedure(s) (LRB): CESAREAN SECTION (N/A) resection of left labial cyst (Left)  Patient location during evaluation: PACU Anesthesia Type: Epidural Level of consciousness: awake, awake and alert and oriented Pain management: pain level controlled Vital Signs Assessment: post-procedure vital signs reviewed and stable Respiratory status: spontaneous breathing, nonlabored ventilation and respiratory function stable Cardiovascular status: stable Postop Assessment: no headache, no backache, epidural receding, patient able to bend at knees and no signs of nausea or vomiting Anesthetic complications: no        Last Vitals:  Vitals:   09/12/16 1210 09/12/16 1228  BP:  109/69  Pulse: 99 76  Resp: 19 16  Temp:  36.4 C    Last Pain:  Vitals:   09/12/16 1228  TempSrc: Oral  PainSc:    Pain Goal:                 Cecile HearingStephen Edward Turk

## 2016-09-12 NOTE — Consult Note (Signed)
Neonatology Note:   Attendance at C-section:    I was asked by Dr. Sallye OberKulwa to attend this primary C/S at 38 weeks for twin B in transverse lie. The mother is a G4P1A2 AB pos, GBS positive with di-di twins. She got Ancef 2 doses beginning about 6 hours before delivery and was afebrile during labor. Twin A delivered vaginally. ROM 7 hours prior to delivery, fluid clear. Infant vigorous with good spontaneous cry and tone. Delayed cord clamping was done. Needed no suctioning. Ap 9/9. Lungs clear to ausc in DR. To CN to care of Pediatrician.   Leslie Souhristie C. Helios Kohlmann, MD

## 2016-09-12 NOTE — Anesthesia Preprocedure Evaluation (Signed)
Anesthesia Evaluation  Patient identified by MRN, date of birth, ID band Patient awake    Reviewed: Allergy & Precautions, Patient's Chart, lab work & pertinent test results  Airway Mallampati: II       Dental no notable dental hx. (+) Teeth Intact   Pulmonary    Pulmonary exam normal breath sounds clear to auscultation       Cardiovascular negative cardio ROS Normal cardiovascular exam Rhythm:Regular Rate:Normal     Neuro/Psych PSYCHIATRIC DISORDERS Depression negative neurological ROS     GI/Hepatic negative GI ROS, Neg liver ROS,   Endo/Other  Obesity  Renal/GU negative Renal ROS  negative genitourinary   Musculoskeletal negative musculoskeletal ROS (+)   Abdominal (+) + obese,   Peds  Hematology negative hematology ROS (+)   Anesthesia Other Findings   Reproductive/Obstetrics (+) Pregnancy                             Anesthesia Physical Anesthesia Plan  ASA: II  Anesthesia Plan: Epidural   Post-op Pain Management:    Induction:   Airway Management Planned: Natural Airway  Additional Equipment:   Intra-op Plan:   Post-operative Plan:   Informed Consent: I have reviewed the patients History and Physical, chart, labs and discussed the procedure including the risks, benefits and alternatives for the proposed anesthesia with the patient or authorized representative who has indicated his/her understanding and acceptance.     Plan Discussed with: Anesthesiologist  Anesthesia Plan Comments:         Anesthesia Quick Evaluation

## 2016-09-12 NOTE — Transfer of Care (Signed)
Immediate Anesthesia Transfer of Care Note  Patient: Leslie SartoriusBrandi N Ayers  Procedure(s) Performed: Procedure(s): CESAREAN SECTION (N/A) resection of left labial cyst (Left)  Patient Location: PACU  Anesthesia Type:Epidural  Level of Consciousness: awake, alert , oriented and patient cooperative  Airway & Oxygen Therapy: Patient Spontanous Breathing  Post-op Assessment: Report given to RN and Post -op Vital signs reviewed and stable  Post vital signs: Reviewed and stable 109/70, 86 NSR, 100% spo2  Last Vitals:  Vitals:   09/12/16 0837 09/12/16 0840  BP: 115/76 112/71  Pulse: 98 (!) 118  Resp: 18 18  Temp:      Last Pain:  Vitals:   09/12/16 0731  TempSrc: Oral  PainSc:          Complications: No apparent anesthesia complications

## 2016-09-13 ENCOUNTER — Encounter (HOSPITAL_COMMUNITY): Payer: Self-pay | Admitting: Obstetrics & Gynecology

## 2016-09-13 LAB — CBC
HEMATOCRIT: 24 % — AB (ref 36.0–46.0)
Hemoglobin: 8.4 g/dL — ABNORMAL LOW (ref 12.0–15.0)
MCH: 30.9 pg (ref 26.0–34.0)
MCHC: 34.2 g/dL (ref 30.0–36.0)
MCV: 90.6 fL (ref 78.0–100.0)
PLATELETS: 127 10*3/uL — AB (ref 150–400)
RBC: 2.65 MIL/uL — AB (ref 3.87–5.11)
RDW: 13.9 % (ref 11.5–15.5)
WBC: 10.5 10*3/uL (ref 4.0–10.5)

## 2016-09-13 MED ORDER — ACETAMINOPHEN FOR CIRCUMCISION 160 MG/5 ML
40.0000 mg | Freq: Once | ORAL | Status: DC
  Filled 2016-09-13: qty 1.25

## 2016-09-13 MED ORDER — LIDOCAINE 1% INJECTION FOR CIRCUMCISION
0.8000 mL | INJECTION | Freq: Once | INTRAVENOUS | Status: DC
  Filled 2016-09-13: qty 1

## 2016-09-13 MED ORDER — BENZOCAINE-MENTHOL 20-0.5 % EX AERO
1.0000 "application " | INHALATION_SPRAY | Freq: Four times a day (QID) | CUTANEOUS | Status: DC | PRN
  Administered 2016-09-13: 1 via TOPICAL
  Filled 2016-09-13: qty 56

## 2016-09-13 MED ORDER — OXYCODONE HCL 5 MG PO TABS
5.0000 mg | ORAL_TABLET | ORAL | Status: DC | PRN
  Administered 2016-09-13 – 2016-09-15 (×7): 5 mg via ORAL
  Filled 2016-09-13 (×7): qty 1

## 2016-09-13 MED ORDER — SUCROSE 24% NICU/PEDS ORAL SOLUTION
0.5000 mL | OROMUCOSAL | Status: DC | PRN
  Filled 2016-09-13: qty 0.5

## 2016-09-13 MED ORDER — EPINEPHRINE TOPICAL FOR CIRCUMCISION 0.1 MG/ML
1.0000 [drp] | TOPICAL | Status: DC | PRN
  Filled 2016-09-13: qty 0.05

## 2016-09-13 MED ORDER — ACETAMINOPHEN FOR CIRCUMCISION 160 MG/5 ML
40.0000 mg | ORAL | Status: DC | PRN
  Filled 2016-09-13: qty 1.25

## 2016-09-13 MED ORDER — ONDANSETRON HCL 4 MG/2ML IJ SOLN: 4.0000 mg | Freq: Three times a day (TID) | INTRAMUSCULAR | Status: DC | PRN

## 2016-09-13 MED ORDER — OXYCODONE HCL 5 MG PO TABS
10.0000 mg | ORAL_TABLET | Freq: Once | ORAL | Status: AC
Start: 2016-09-13 — End: 2016-09-13
  Administered 2016-09-13: 10 mg via ORAL
  Filled 2016-09-13: qty 2

## 2016-09-13 NOTE — Addendum Note (Signed)
Addendum  created 09/13/16 1217 by Earmon PhoenixValerie P Haruna Rohlfs, CRNA   Sign clinical note

## 2016-09-13 NOTE — Clinical Social Work Maternal (Signed)
  CLINICAL SOCIAL WORK MATERNAL/CHILD NOTE  Patient Details  Name: Leslie Ayers MRN: [id number] Date of Birth: 1991-05-31  Date:  09/13/2016  Clinical Social Worker Initiating Note:  Leslie Ayers, MSW, LCSW-A Date/ Time Initiated:  09/13/16/1021     Child's Name:  Leslie Ayers (Twin boy A)  and Leslie Ayers (Twin boy B)  Scientist, physiological Guardian:  Other (Comment) (Not established by court system; MOB and FOB parent together collectively )   Need for Interpreter:  None   Date of Referral:  09/12/16     Reason for Referral:  Other (Comment) (MOB hx of depression )   Referral Source:  RN   Address:  [place], [place] [address]  Phone number:  [phone]   Household Members:  Self, Minor Children, Significant Other   Natural Supports (not living in the home):  Immediate Family, Friends, Extended Family   Professional Supports: Case Metallurgist (Has a DSS case worker to assist with Medicaid and [place] )   Employment: Animator   Type of Work:     Education:      Museum/gallery curator Resources:  [employer], Loss adjuster, chartered)   Other Resources:  [facility]   Cultural/Religious Considerations Which May Impact Care:  Holiness   Strengths:  Ability to meet basic needs , Compliance with medical plan , Home prepared for child , Pediatrician chosen  Industrial/product designer. [employer] )   Risk Factors/Current Problems:  None   Cognitive State:  Able to Concentrate , Alert , Goal Oriented , Insightful    Mood/Affect:  Comfortable , Calm , Relaxed , Interested    CSW Assessment: CSW met with MOB at bedside to complete assessment for consult regarding hx of depression. Upon this writers arrival, twin boys were asleep in basinet while MOB was laying in bed. MOB was warm and welcoming of this writers visit. This Probation officer explained role and reasoning for visit. At this time, MOB confirms experiencing depression during the beginning of her pregnancy only. MOB denies any  other hx of depression. She notes it was strictly situational as finding out she was pregnant with twins was hard initially. This Probation officer empathized with MOB noting unplanned pregnancy is surprising to anyone and having twins is double the surprise and is natural to cause a change in emotions initially. This Probation officer assessed MOB's support system. MOB notes she has a great support system to include FOB, her mom and her 6yo daughter. MOB denies the need for any meds and/or outpatient follow up for treatment of depression. This Probation officer educated MOB on PPD and safe sleeping. This Probation officer educated MOB on preventive measures for PPD such as breast feeding, adequate sleep, eating healthy meals with a lot of nutrients and staying hydrated. This Probation officer also encouraged MOB to continue taking her pre-natal vitamins as they will help with her hormone level changes and overall energy level. MOB verbalized understanding. Upon further assessment of psychosocial needs, MOB notes she does not have any further needs or questions/concerns. CSW feels MOB is prepared and equip to care for twin baby's and has no barriers to d/c; thus, case closed to this CSW.   CSW Plan/Description:  Engineer, mining , Information/Referral to [employer] , No Further Intervention Required/No Barriers to Discharge   [employer], MSW, [facility]  Office: [phone]

## 2016-09-13 NOTE — Lactation Note (Signed)
This note was copied from a baby's chart. Lactation Consultation Note  Patient Name: Leslie Ayers UXLKG'MToday's Date: 09/13/2016 Reason for consult: Follow-up assessment;Infant weight loss;Multiple gestation (post circ )  Baby B Twin :  Baby B is 8632 hours old , and has not been STS or fed in while. LC checked diaper , noted top be dry , scant bleeding from circ.  Baby work up when checking diaper , and LC placed baby STS on the left breast / football/  And baby latched easily, few swallows noted and fed 10 mins, and released on his own.  LC reassured mom for a feeding post circ the baby did well.  Mom was comfortable while he was feeding and feeding both twins together.  LC called MBU RN Jamesetta OrleansBrook Joyce and let her know mom can be set up with a DEBP for post pumping and mom is aware to post pump when the babies aren't cluster feeding.   Maternal Data Has patient been taught Hand Expression?: Yes  Feeding Feeding Type: Breast Fed Length of feed: 10 min (swallows noted)  LATCH Score/Interventions Latch: Grasps breast easily, tongue down, lips flanged, rhythmical sucking.  Audible Swallowing: A few with stimulation  Type of Nipple: Everted at rest and after stimulation  Comfort (Breast/Nipple): Soft / non-tender     Hold (Positioning): Assistance needed to correctly position infant at breast and maintain latch. Intervention(s): Breastfeeding basics reviewed;Support Pillows;Position options;Skin to skin  LATCH Score: 8  Lactation Tools Discussed/Used     Consult Status Consult Status: Follow-up Date: 09/14/16 Follow-up type: In-patient    Matilde SprangMargaret Ann Haiven Nardone 09/13/2016, 5:23 PM

## 2016-09-13 NOTE — Anesthesia Postprocedure Evaluation (Signed)
Anesthesia Post Note  Patient: Leslie SartoriusBrandi N Demedeiros  Procedure(s) Performed: Procedure(s) (LRB): CESAREAN SECTION (N/A) resection of left labial cyst (Left)  Patient location during evaluation: Mother Baby Anesthesia Type: Epidural Level of consciousness: awake and awake and alert Pain management: pain level controlled Vital Signs Assessment: post-procedure vital signs reviewed and stable Respiratory status: spontaneous breathing Cardiovascular status: stable Postop Assessment: no headache, no backache and epidural receding Anesthetic complications: no        Last Vitals:  Vitals:   09/13/16 0512 09/13/16 0915  BP: (!) 103/56 (!) 103/49  Pulse: 76 73  Resp: 20 18  Temp: 36.8 C 37.5 C    Last Pain:  Vitals:   09/13/16 0915  TempSrc: Oral  PainSc: 0-No pain   Pain Goal: Patients Stated Pain Goal: 3 (09/12/16 1802)               Edison PaceWILKERSON,Augustus Zurawski

## 2016-09-13 NOTE — Lactation Note (Signed)
This note was copied from a baby's chart. Lactation Consultation Note  Patient Name: Boy Su MonksBrandi Owczarzak ZOXWR'UToday's Date: 09/13/2016 Reason for consult: Follow-up assessment;Multiple gestation;Infant weight loss (post circ, diaper changed/ dry/ scant bleeding / changed to new diaper )  Baby A  Baby A 32 hours old, post circ, has been several hours since the baby has fed and placed STS.  LC assisted mom with latch on the right breast / football / baby woke up / opened wide and fed 12 mins with few swallows. Became non - nutritive - so LC had mom release suction , and nipple slightly pinched on the sides.  LC discussed since both babies were circ'd potential for being sleepy , fussy may be present.  Reassured mom when the Tylenol wears off the baby may cluster feed.  Mom asked about  Pumping. And mentioned with her 1st baby she had a difficult time an donly ended up breast feeding a few weeks/ lack of supply.  LC recommended a DEBP can be set up, and to post pump when the babies aren't cluster feeding.      Maternal Data Has patient been taught Hand Expression?: Yes  Feeding Feeding Type: Breast Fed Length of feed: 12 min (few swallows noted , depth achieved )  LATCH Score/Interventions Latch: Grasps breast easily, tongue down, lips flanged, rhythmical sucking. (right breast / football )  Audible Swallowing: A few with stimulation  Type of Nipple: Everted at rest and after stimulation  Comfort (Breast/Nipple): Soft / non-tender     Hold (Positioning): Assistance needed to correctly position infant at breast and maintain latch. Intervention(s): Breastfeeding basics reviewed;Support Pillows;Position options;Skin to skin  LATCH Score: 8  Lactation Tools Discussed/Used     Consult Status Consult Status: Follow-up Date: 09/14/16 Follow-up type: In-patient    Matilde SprangMargaret Ann Demari Kropp 09/13/2016, 5:13 PM

## 2016-09-13 NOTE — Progress Notes (Signed)
Subjective: Postpartum Day 1: Twin A SVD, Twin B Cesarean Delivery due to malpresentation, left labial cyst resection. Patient reports tolerating PO, + flatus and no problems voiding.  Ambulating.  Pain controlled.  Breastfeeding going well.  Objective: Vital signs in last 24 hours: Temp:  [96.8 F (36 C)-99.5 F (37.5 C)] 99.5 F (37.5 C) (02/04 0915) Pulse Rate:  [65-99] 73 (02/04 0915) Resp:  [13-24] 18 (02/04 0915) BP: (101-116)/(49-83) 103/49 (02/04 0915) SpO2:  [97 %-100 %] 98 % (02/04 0915)  Physical Exam:  General: alert, cooperative and no distress Lochia: appropriate Uterine Fundus: firm Incision: healing well, dressing intact. DVT Evaluation: No evidence of DVT seen on physical exam. Negative Homan's sign.   Recent Labs  09/12/16 0215 09/13/16 0557  HGB 12.3 8.4*  HCT 35.4* 24.0*    Assessment/Plan: Status post Cesarean section. Doing well postoperatively.  Continue current care. Desires inpt circ for both infants.  Leslie Ayers 09/13/2016, 11:27 AM

## 2016-09-14 MED ORDER — FERROUS SULFATE 325 (65 FE) MG PO TABS
325.0000 mg | ORAL_TABLET | Freq: Two times a day (BID) | ORAL | Status: DC
  Administered 2016-09-14 – 2016-09-15 (×3): 325 mg via ORAL
  Filled 2016-09-14 (×3): qty 1

## 2016-09-14 MED ORDER — HYDROMORPHONE HCL 2 MG PO TABS: 2.0000 mg | ORAL_TABLET | ORAL | Status: DC | PRN

## 2016-09-14 NOTE — Lactation Note (Signed)
This note was copied from a baby's chart. Lactation Consultation Note  Patient Name: Boy Su MonksBrandi Hackman DGLOV'FToday's Date: 09/14/2016 Reason for consult: Follow-up assessment Babies at 54 hr of life. Mom reports her nipples are sore, she is tired, and it is hard for her to get around. She last bf at 0930. She declined pumping at this time. She plans to offer the breast and pump "when I feel better". She would like to bf the babies for 6630m. Explained the need for stimulation and the risks of formula bottles. She voiced understanding. Switched babies to [employer]. RN was notified. She will call for lactation as needed.   Maternal Data    Feeding Feeding Type: Formula  LATCH Score/Interventions                      Lactation Tools Discussed/Used     Consult Status Consult Status: PRN    Rulon Eisenmengerlizabeth E Janah Mcculloh 09/14/2016, 2:42 PM

## 2016-09-14 NOTE — Progress Notes (Signed)
Leslie SartoriusBrandi N Mcminn [id number] Postpartum Day 2 S/P SVD, Primary Cesarean Section due to Fetal Malpresentation  Subjective: Patient up ad lib, denies syncope or dizziness. Reports consuming regular diet without issues and denies N/V. Patient reports bowel movement this morning and passing flatus.  States that she is no longer having issues with urination. Denies issues with urination and reports bleeding is "okay, not really bad."  Patient is breastfeeding and reports going okay, but babies are being supplemented. Pain is being appropriately managed with use of motrin, oxycodone, and motrin.   Objective: Temp:  [98.2 F (36.8 C)-99.5 F (37.5 C)] 98.2 F (36.8 C) (02/05 0500) Pulse Rate:  [73-89] 80 (02/05 0500) Resp:  [18] 18 (02/05 0500) BP: (103-123)/(49-75) 123/75 (02/05 0500) SpO2:  [98 %] 98 % (02/04 0915)   Recent Labs  09/12/16 0215 09/13/16 0557  HGB 12.3 8.4*  HCT 35.4* 24.0*  WBC 7.6 10.5    Physical Exam:  General: alert, cooperative and no distress Mood/Affect: Appropriate/Appropriate Lungs: clear to auscultation, no wheezes, rales or rhonchi, symmetric air entry.  Heart: normal rate and regular rhythm. Breast: not examined. Abdomen:  + bowel sounds, Soft, Appropriately Tender Incision: no significant drainage, Honeycomb dressing  Uterine Fundus: firm, U/-2 Lochia: appropriate Skin: Warm, Dry. DVT Evaluation: Mild Calf/Ankle edema is present. JP drain:   None  Assessment Post Operative Day 2 S/P Primary C/S S/P SVD S/P Labial Cysts I&D Normal Involution BreastFeeding Hemodynamically Stable  Plan: -Encouraged ambulation -Continue to manage pain -Anticipate D/C tomorrow -Continue other mgmt as ordered -Dr. ND to be updated on patient status  Cherre RobinsJessica L Maxwell Martorano MSN, CNM 09/14/2016, 6:25 AM

## 2016-09-15 ENCOUNTER — Ambulatory Visit: Payer: Self-pay

## 2016-09-15 MED ORDER — OXYCODONE HCL 5 MG PO TABS: 5.0000 mg | ORAL_TABLET | ORAL | 0 refills | Status: DC | PRN

## 2016-09-15 MED ORDER — IBUPROFEN 600 MG PO TABS
600.0000 mg | ORAL_TABLET | Freq: Four times a day (QID) | ORAL | 0 refills | Status: DC
Start: 2016-09-15 — End: 2022-09-17

## 2016-09-15 NOTE — Discharge Summary (Signed)
OB Discharge Summary     Patient Name: Leslie Ayers DOB: [birth year] MRN: [id number]  Date of admission: 09/12/2016 Delivering MD:    Preslee, Regas [[id number]]  [place], EMA   Myeshia, Fojtik [[id number]]  Sallye Ober, EMA   Date of discharge: 09/15/2016  Admitting diagnosis: Induction Intrauterine pregnancy: [redacted]w[redacted]d     Secondary diagnosis:  Principal Problem:   Delivered by cesarean section--Twin B, Twin A delivered vaginally Active Problems:   Indication for care or intervention in labor or delivery   Twin gestation, dichorionic diamniotic   Group beta Strep positive   Penicillin allergy   Depressive disorder   Twin pregnancy, twins dichorionic and diamniotic   Request for sterilization  Additional problems: None     Discharge diagnosis: Term Pregnancy Delivered and Twins                                                                                                Post partum procedures:None  Augmentation: AROM and Pitocin  Complications: None  Hospital course:  Induction of Labor With Cesarean Section  26 y.o. yo [id number] at [redacted]w[redacted]d was admitted to the hospital 09/12/2016 for induction of labor. Patient had a labor course significant for twin A delivered vaginally, Twin B turned breech, external version tried without success, Twin B delivered per LTCS. The patient went for cesarean section due to Malpresentation, and delivered a Viable infant,@BABYSUPPRESS (DBLINK,ept,110,,1,,) Membrane Rupture Time/Date: )   Maelle, Sheaffer [[id number]]  2:36 AM   Duanne Moron [[id number]]  2:36 AM ,   Kelsay, Haggard [[id number]]  09/12/2016   Gerrie, Castiglia [[id number]]  09/12/2016   @Details  of operation can be found in separate operative Note.  Patient had an uncomplicated postpartum course. She is ambulating, tolerating a regular diet, passing flatus, and urinating well.  Patient is discharged home in stable condition on 09/15/16.                                     Physical exam  Vitals:   09/13/16 1800 09/14/16 0500 09/14/16 1752 09/15/16 0500  BP: 121/66 123/75 117/70 109/76  Pulse: 89 80 84 76  Resp: 18 18 18 18   Temp: 98.2 F (36.8 C) 98.2 F (36.8 C) 98.7 F (37.1 C) 98.1 F (36.7 C)  TempSrc:  Oral Axillary Oral  SpO2:      Weight:      Height:       General: alert, cooperative and no distress Lochia: appropriate Uterine Fundus: firm Incision: Dressing is clean, dry, and intact Perineum healing well DVT Evaluation: No evidence of DVT seen on physical exam. Negative Homan's sign. Labs: Lab Results  Component Value Date   WBC 10.5 09/13/2016   HGB 8.4 (L) 09/13/2016   HCT 24.0 (L) 09/13/2016   MCV 90.6 09/13/2016   PLT 127 (L) 09/13/2016   CMP Latest Ref Rng & Units 07/29/2016  Glucose 65 - 99 mg/dL 79  BUN 6 - 20 mg/dL 6  Creatinine 3.87 - 5.64 mg/dL  0.46  Sodium 135 - 145 mmol/L 135  Potassium 3.5 - 5.1 mmol/L 3.4(L)  Chloride 101 - 111 mmol/L 107  CO2 22 - 32 mmol/L 21(L)  Calcium 8.9 - 10.3 mg/dL 0.4(V8.4(L)  Total Protein 6.5 - 8.1 g/dL 6.4(L)  Total Bilirubin 0.3 - 1.2 mg/dL 0.3  Alkaline Phos 38 - 126 U/L 76  AST 15 - 41 U/L 17  ALT 14 - 54 U/L 11(L)    Discharge instruction: per After Visit Summary and "Baby and Me Booklet".  After visit meds:  Allergies as of 09/15/2016      Reactions   Penicillins Rash   Has patient had a Has patient had a PCN reaction that required hospitalization No Has patient had a PCN reaction occurring within the last 10 years: No   PCN reaction causing immediate rash, facial/tongue/throat swelling, SOB or lightheadedness with hypotension: No Has patient had a PCN reaction causing severe rash involving mucus membranes or skin necrosis: No If all of the above answers are "NO", then may proceed with Cephalosporin use.      Medication List    TAKE these medications   ferrous sulfate 325 (65 FE) MG tablet Take 325 mg by mouth 2 (two) times daily.   ibuprofen 600 MG  tablet Commonly known as:  ADVIL,MOTRIN Take 1 tablet (600 mg total) by mouth every 6 (six) hours.   oxyCODONE 5 MG immediate release tablet Commonly known as:  Oxy IR/ROXICODONE Take 1 tablet (5 mg total) by mouth every 4 (four) hours as needed for moderate pain.   prenatal multivitamin Tabs tablet Take 1 tablet by mouth daily at 12 noon.   Vitamin D 2000 units tablet Take 2 tablets (4,000 Units total) by mouth daily.       Diet: routine diet  Activity: Advance as tolerated. Pelvic rest for 6 weeks.   Outpatient follow up:6 weeks Follow up Appt:No future appointments. Follow up Visit:No Follow-up on file.  Postpartum contraception: Tubal Ligation  Newborn Data:   Adele BarthelHanna, Boy Mikenna [[id number]][[id number]]  Live born female  Birth Weight: 6 lb 15.6 oz (3164 g) APGAR: 9, 9   Duanne MoronHanna, BoyB Shaneequa [[id number]][[id number]]  Live born female  Birth Weight: 7 lb 0.5 oz (3190 g) APGAR: ,   Baby Feeding: Bottle and Breast Disposition:home with mother   09/15/2016 Kenney HousemanNancy Jean Ricki Clack, CNM

## 2016-09-15 NOTE — Lactation Note (Signed)
This note was copied from a baby's chart. Lactation Consultation Note: mother told staff nurse her nipples were sore. Comfort gels given. Advised mother to breastfeed infant more frequently to prevent engorgement. Suggested to do good breast massage and ice breast for 15 mins every 3-4 hours . Mother is mostly bottle feeding. She states she plans to purchase an electric pump today. Advised to pump every 3 hours for 15 mins. Mother aware of available Lactation services and community support.  Patient Name: Leslie Ayers ZOXWR'UToday's Date: 09/15/2016     Maternal Data    Feeding Feeding Type: Bottle Fed - Formula  LATCH Score/Interventions                      Lactation Tools Discussed/Used     Consult Status      Leslie BornKendrick, Leslie Ayers 09/15/2016, 1:30 PM

## 2016-09-16 LAB — BODY FLUID CULTURE
Culture: NO GROWTH
Special Requests: NORMAL

## 2017-02-02 ENCOUNTER — Encounter (HOSPITAL_COMMUNITY): Payer: Self-pay | Admitting: *Deleted

## 2017-02-02 ENCOUNTER — Inpatient Hospital Stay (HOSPITAL_COMMUNITY)
Admission: AD | Admit: 2017-02-02 | Discharge: 2017-02-02 | Disposition: A | Discharge status: Home / Self Care | Payer: BLUE CROSS/BLUE SHIELD | Source: Ambulatory Visit | Attending: Obstetrics and Gynecology | Admitting: Obstetrics and Gynecology

## 2017-02-02 DIAGNOSIS — N898 Other specified noninflammatory disorders of vagina: Secondary | ICD-10-CM | POA: Diagnosis present

## 2017-02-02 DIAGNOSIS — N760 Acute vaginitis: Secondary | ICD-10-CM

## 2017-02-02 DIAGNOSIS — Z880 Allergy status to penicillin: Secondary | ICD-10-CM | POA: Insufficient documentation

## 2017-02-02 DIAGNOSIS — B9689 Other specified bacterial agents as the cause of diseases classified elsewhere: Secondary | ICD-10-CM

## 2017-02-02 DIAGNOSIS — M549 Dorsalgia, unspecified: Secondary | ICD-10-CM | POA: Insufficient documentation

## 2017-02-02 DIAGNOSIS — Z8379 Family history of other diseases of the digestive system: Secondary | ICD-10-CM | POA: Insufficient documentation

## 2017-02-02 DIAGNOSIS — Z833 Family history of diabetes mellitus: Secondary | ICD-10-CM | POA: Diagnosis not present

## 2017-02-02 LAB — URINALYSIS, ROUTINE W REFLEX MICROSCOPIC
Bilirubin Urine: NEGATIVE
GLUCOSE, UA: NEGATIVE mg/dL
HGB URINE DIPSTICK: NEGATIVE
Ketones, ur: NEGATIVE mg/dL
NITRITE: NEGATIVE
PH: 6 (ref 5.0–8.0)
Protein, ur: NEGATIVE mg/dL
SPECIFIC GRAVITY, URINE: 1.009 (ref 1.005–1.030)

## 2017-02-02 LAB — WET PREP, GENITAL
SPERM: NONE SEEN
Trich, Wet Prep: NONE SEEN
Yeast Wet Prep HPF POC: NONE SEEN

## 2017-02-02 LAB — POCT PREGNANCY, URINE: PREG TEST UR: NEGATIVE

## 2017-02-02 MED ORDER — IBUPROFEN 800 MG PO TABS
800.0000 mg | ORAL_TABLET | Freq: Once | ORAL | Status: AC
  Administered 2017-02-02: 800 mg via ORAL
  Filled 2017-02-02: qty 1

## 2017-02-02 MED ORDER — METRONIDAZOLE 500 MG PO TABS: 500.0000 mg | ORAL_TABLET | Freq: Two times a day (BID) | ORAL | 0 refills | Status: DC

## 2017-02-02 NOTE — Discharge Instructions (Signed)
Bacterial Vaginosis Bacterial vaginosis is a vaginal infection that occurs when the normal balance of bacteria in the vagina is disrupted. It results from an overgrowth of certain bacteria. This is the most common vaginal infection among women ages 15-44. Because bacterial vaginosis increases your risk for STIs (sexually transmitted infections), getting treated can help reduce your risk for chlamydia, gonorrhea, herpes, and HIV (human immunodeficiency virus). Treatment is also important for preventing complications in pregnant women, because this condition can cause an early (premature) delivery. What are the causes? This condition is caused by an increase in harmful bacteria that are normally present in small amounts in the vagina. However, the reason that the condition develops is not fully understood. What increases the risk? The following factors may make you more likely to develop this condition:  Having a new sexual partner or multiple sexual partners.  Having unprotected sex.  Douching.  Having an intrauterine device (IUD).  Smoking.  Drug and alcohol abuse.  Taking certain antibiotic medicines.  Being pregnant.  You cannot get bacterial vaginosis from toilet seats, bedding, swimming pools, or contact with objects around you. What are the signs or symptoms? Symptoms of this condition include:  Grey or white vaginal discharge. The discharge can also be watery or foamy.  A fish-like odor with discharge, especially after sexual intercourse or during menstruation.  Itching in and around the vagina.  Burning or pain with urination.  Some women with bacterial vaginosis have no signs or symptoms. How is this diagnosed? This condition is diagnosed based on:  Your medical history.  A physical exam of the vagina.  Testing a sample of vaginal fluid under a microscope to look for a large amount of bad bacteria or abnormal cells. Your health care provider may use a cotton swab  or a small wooden spatula to collect the sample.  How is this treated? This condition is treated with antibiotics. These may be given as a pill, a vaginal cream, or a medicine that is put into the vagina (suppository). If the condition comes back after treatment, a second round of antibiotics may be needed. Follow these instructions at home: Medicines  Take over-the-counter and prescription medicines only as told by your health care provider.  Take or use your antibiotic as told by your health care provider. Do not stop taking or using the antibiotic even if you start to feel better. General instructions  If you have a female sexual partner, tell her that you have a vaginal infection. She should see her health care provider and be treated if she has symptoms. If you have a female sexual partner, he does not need treatment.  During treatment: ? Avoid sexual activity until you finish treatment. ? Do not douche. ? Avoid alcohol as directed by your health care provider. ? Avoid breastfeeding as directed by your health care provider.  Drink enough water and fluids to keep your urine clear or pale yellow.  Keep the area around your vagina and rectum clean. ? Wash the area daily with warm water. ? Wipe yourself from front to back after using the toilet.  Keep all follow-up visits as told by your health care provider. This is important. How is this prevented?  Do not douche.  Wash the outside of your vagina with warm water only.  Use protection when having sex. This includes latex condoms and dental dams.  Limit how many sexual partners you have. To help prevent bacterial vaginosis, it is best to have sex with just   one partner (monogamous).  Make sure you and your sexual partner are tested for STIs.  Wear cotton or cotton-lined underwear.  Avoid wearing tight pants and pantyhose, especially during summer.  Limit the amount of alcohol that you drink.  Do not use any products that  contain nicotine or tobacco, such as cigarettes and e-cigarettes. If you need help quitting, ask your health care provider.  Do not use illegal drugs. Where to find more information:  Centers for Disease Control and Prevention: www.cdc.gov/std  American Sexual Health Association (ASHA): www.ashastd.org  U.S. Department of Health and Human Services, Office on Women's Health: www.womenshealth.gov/ or https://www.womenshealth.gov/a-z-topics/bacterial-vaginosis Contact a health care provider if:  Your symptoms do not improve, even after treatment.  You have more discharge or pain when urinating.  You have a fever.  You have pain in your abdomen.  You have pain during sex.  You have vaginal bleeding between periods. Summary  Bacterial vaginosis is a vaginal infection that occurs when the normal balance of bacteria in the vagina is disrupted.  Because bacterial vaginosis increases your risk for STIs (sexually transmitted infections), getting treated can help reduce your risk for chlamydia, gonorrhea, herpes, and HIV (human immunodeficiency virus). Treatment is also important for preventing complications in pregnant women, because the condition can cause an early (premature) delivery.  This condition is treated with antibiotic medicines. These may be given as a pill, a vaginal cream, or a medicine that is put into the vagina (suppository). This information is not intended to replace advice given to you by your health care provider. Make sure you discuss any questions you have with your health care provider. Document Released: 07/27/2005 Document Revised: 04/11/2016 Document Reviewed: 04/11/2016 Elsevier Interactive Patient Education  2017 Elsevier Inc.  

## 2017-02-02 NOTE — MAU Provider Note (Signed)
History    Leslie Ayers is a 26y.o. [id number] who presents, unannounced, for vaginal discharge.  States discharge has been noted for one month with foul odor. Patient states she was not too concerned with discharge and odor until back pain started.  Patient states she had similar symptoms with CT diagnosis.  Patient reports she has not attempted any OTC medications for back pain.  Patient also expresses concern with heavy menses since cycle started about 2 months ago.  Patient states she is saturating through 6-7 pads daily while on menses.    Patient Active Problem List   Diagnosis Date Noted  . Penicillin allergy 09/12/2016  . Depressive disorder 09/12/2016    No chief complaint on file.  HPI  OB History    Gravida Para Term Preterm AB Living   4 2 2   2 2    SAB TAB Ectopic Multiple Live Births   2 0   1 2      Past Medical History:  Diagnosis Date  . Anal fissure   . History of echocardiogram    Echo 11/17: EF 60-65, no RWMA  . Hx of chlamydia infection     Past Surgical History:  Procedure Laterality Date  . CESAREAN SECTION N/A 09/12/2016   Procedure: CESAREAN SECTION;  Surgeon: Hoover Browns, MD;  Location: [facility];  Service: Obstetrics;  Laterality: N/A;  . CYST EXCISION Left 09/12/2016   Procedure: resection of left labial cyst;  Surgeon: Hoover Browns, MD;  Location: [facility]TH[facility];  Service: Obstetrics;  Laterality: Left;  . 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 Irritable bowel syndrome Mother   . Diabetes Mother   . Arrhythmia Maternal Grandmother   . Stroke Maternal Grandmother   . Diabetes Maternal Grandfather   . Stroke Maternal Uncle   . Colon cancer Neg Hx     Social History  Substance Use Topics  . Smoking status: Never Smoker  . Smokeless tobacco: Never Used  . Alcohol use Yes     Comment: LAST DRANK-  LAST MTH    Allergies:  Allergies  Allergen Reactions  . Penicillins  Rash    Has patient had a Has patient had a PCN reaction that required hospitalization No Has patient had a PCN reaction occurring within the last 10 years: No   PCN reaction causing immediate rash, facial/tongue/throat swelling, SOB or lightheadedness with hypotension: No Has patient had a PCN reaction causing severe rash involving mucus membranes or skin necrosis: No If all of the above answers are "NO", then may proceed with Cephalosporin use.     Prescriptions Prior to Admission  Medication Sig Dispense Refill Last Dose  . Cholecalciferol (VITAMIN D) 2000 units tablet Take 2 tablets (4,000 Units total) by mouth daily. 60 tablet 2 09/11/2016  . ferrous sulfate 325 (65 FE) MG tablet Take 325 mg by mouth 2 (two) times daily.  1 09/11/2016  . ibuprofen (ADVIL,MOTRIN) 600 MG tablet Take 1 tablet (600 mg total) by mouth every 6 (six) hours. 30 tablet 0   . oxyCODONE (OXY IR/ROXICODONE) 5 MG immediate release tablet Take 1 tablet (5 mg total) by mouth every 4 (four) hours as needed for moderate pain. 30 tablet 0   . Prenatal Vit-Fe Fumarate-FA (PRENATAL MULTIVITAMIN) TABS tablet Take 1 tablet by mouth daily at 12 noon.   09/11/2016    Review of Systems  Constitutional: Negative for chills and fever.  Gastrointestinal: Negative for abdominal pain, constipation, diarrhea, nausea and vomiting.  Genitourinary: Negative for dysuria, flank pain, frequency and urgency.  Musculoskeletal: Positive for back pain.    See HPI Above Physical Exam   Blood pressure 136/72, pulse 60, temperature 97.7 F (36.5 C), temperature source Oral, resp. rate 16, height 5\' 6"  (1.676 m), weight 79.8 kg (176 lb), last menstrual period 01/21/2017, SpO2 100 %, unknown if currently breastfeeding.  Results for orders placed or performed during the hospital encounter of 02/02/17 (from the past 24 hour(s))  Urinalysis, Routine w reflex microscopic     Status: Abnormal   Collection Time: 02/02/17  8:04 PM  Result Value Ref  Range   Color, Urine YELLOW YELLOW   APPearance CLEAR CLEAR   Specific Gravity, Urine 1.009 1.005 - 1.030   pH 6.0 5.0 - 8.0   Glucose, UA NEGATIVE NEGATIVE mg/dL   Hgb urine dipstick NEGATIVE NEGATIVE   Bilirubin Urine NEGATIVE NEGATIVE   Ketones, ur NEGATIVE NEGATIVE mg/dL   Protein, ur NEGATIVE NEGATIVE mg/dL   Nitrite NEGATIVE NEGATIVE   Leukocytes, UA TRACE (A) NEGATIVE   RBC / HPF 0-5 0 - 5 RBC/hpf   WBC, UA 0-5 0 - 5 WBC/hpf   Bacteria, UA RARE (A) NONE SEEN   Squamous Epithelial / LPF 0-5 (A) NONE SEEN   Mucous PRESENT   Pregnancy, urine POC     Status: None   Collection Time: 02/02/17  8:30 PM  Result Value Ref Range   Preg Test, Ur NEGATIVE NEGATIVE  Wet prep, genital     Status: Abnormal   Collection Time: 02/02/17  9:20 PM  Result Value Ref Range   Yeast Wet Prep HPF POC NONE SEEN NONE SEEN   Trich, Wet Prep NONE SEEN NONE SEEN   Clue Cells Wet Prep HPF POC PRESENT (A) NONE SEEN   WBC, Wet Prep HPF POC MODERATE (A) NONE SEEN   Sperm NONE SEEN     Physical Exam  Constitutional: She is oriented to person, place, and time. She appears well-developed and well-nourished. No distress.  HENT:  Head: Normocephalic and atraumatic.  Eyes: Conjunctivae are normal.  Neck: Normal range of motion.  Cardiovascular: Normal rate, regular rhythm and normal heart sounds.   Respiratory: Effort normal and breath sounds normal.  GI: Soft. Bowel sounds are normal. She exhibits no distension.  Genitourinary: Uterus is not enlarged and not tender. Cervix exhibits no motion tenderness and no discharge. No bleeding in the vagina. No vaginal discharge found.  Genitourinary Comments: Speculum Exam: -Vaginal Vault: No apparent discharge noted, but malodor present -wet prep collected -Cervix:Appears open, but no discharge from os-GC/CT collected -Bimanual Exam: No tenderness or CMT. Adnexa normal   Musculoskeletal: Normal range of motion. She exhibits no edema.  Neurological: She is alert  and oriented to person, place, and time.  Skin: Skin is warm and dry.     ED Course  Assessment: 26y.o. Female Vaginal Discharge Back Pain  Plan: -Labs: UA, Wet Prep, GC/CT -PE as above -Discussed follow up in office for menorrhagia; Patient encouraged to make appt with MD to discuss options for treatment  Follow Up (2130) -Pos. Clue Cells-Bacterial Vaginosis -Discussed ways to prevent bacterial vaginosis:  +Wear cotton underwear +Use low scent or scent free soaps and detergents +No douching -Encouraged to call if any questions or concerns arise prior to next scheduled office visit.  -Discharged to home in stable condition  Cherre RobinsJessica L Rojean Ige CNM,  MSN 02/02/2017 8:57 PM

## 2017-02-02 NOTE — MAU Note (Signed)
Pt reports a lot of thin, "tan" vaginal discharge with an odor that started about 1 month ago. Pt denies itching, but does have some discomfort. States she has some mild lower abdominal cramping and lower back pain. Has not taken anything for pain. Pt denies urinary s/s.

## 2017-02-03 LAB — GC/CHLAMYDIA PROBE AMP (~~LOC~~) NOT AT ARMC
Chlamydia: NEGATIVE
Neisseria Gonorrhea: NEGATIVE

## 2017-03-22 ENCOUNTER — Encounter (HOSPITAL_COMMUNITY): Payer: Self-pay | Admitting: *Deleted

## 2017-03-22 ENCOUNTER — Inpatient Hospital Stay (HOSPITAL_COMMUNITY)
Admission: AD | Admit: 2017-03-22 | Discharge: 2017-03-22 | Disposition: A | Discharge status: Home / Self Care | Payer: BLUE CROSS/BLUE SHIELD | Source: Ambulatory Visit | Attending: Obstetrics and Gynecology | Admitting: Obstetrics and Gynecology

## 2017-03-22 DIAGNOSIS — Z8249 Family history of ischemic heart disease and other diseases of the circulatory system: Secondary | ICD-10-CM | POA: Diagnosis not present

## 2017-03-22 DIAGNOSIS — Z880 Allergy status to penicillin: Secondary | ICD-10-CM | POA: Insufficient documentation

## 2017-03-22 DIAGNOSIS — Z79899 Other long term (current) drug therapy: Secondary | ICD-10-CM | POA: Insufficient documentation

## 2017-03-22 DIAGNOSIS — Z9889 Other specified postprocedural states: Secondary | ICD-10-CM | POA: Insufficient documentation

## 2017-03-22 DIAGNOSIS — Z823 Family history of stroke: Secondary | ICD-10-CM | POA: Insufficient documentation

## 2017-03-22 DIAGNOSIS — N938 Other specified abnormal uterine and vaginal bleeding: Secondary | ICD-10-CM | POA: Diagnosis present

## 2017-03-22 DIAGNOSIS — Z833 Family history of diabetes mellitus: Secondary | ICD-10-CM | POA: Insufficient documentation

## 2017-03-22 HISTORY — DX: Unspecified abnormal cytological findings in specimens from vagina: R87.629

## 2017-03-22 LAB — CBC
HEMATOCRIT: 39.1 % (ref 36.0–46.0)
Hemoglobin: 12.5 g/dL (ref 12.0–15.0)
MCH: 27.7 pg (ref 26.0–34.0)
MCHC: 32 g/dL (ref 30.0–36.0)
MCV: 86.7 fL (ref 78.0–100.0)
PLATELETS: 218 10*3/uL (ref 150–400)
RBC: 4.51 MIL/uL (ref 3.87–5.11)
RDW: 14.2 % (ref 11.5–15.5)
WBC: 5.2 10*3/uL (ref 4.0–10.5)

## 2017-03-22 LAB — URINALYSIS, ROUTINE W REFLEX MICROSCOPIC

## 2017-03-22 LAB — URINALYSIS, MICROSCOPIC (REFLEX)

## 2017-03-22 LAB — WET PREP, GENITAL
CLUE CELLS WET PREP: NONE SEEN
SPERM: NONE SEEN
Trich, Wet Prep: NONE SEEN
Yeast Wet Prep HPF POC: NONE SEEN

## 2017-03-22 LAB — POCT PREGNANCY, URINE: PREG TEST UR: NEGATIVE

## 2017-03-22 NOTE — Discharge Instructions (Signed)
Dysfunctional Uterine Bleeding °Dysfunctional uterine bleeding is abnormal bleeding from the uterus. Dysfunctional uterine bleeding includes: °· A period that comes earlier or later than usual. °· A period that is lighter, heavier, or has blood clots. °· Bleeding between periods. °· Skipping one or more periods. °· Bleeding after sexual intercourse. °· Bleeding after menopause. ° °Follow these instructions at home: °Pay attention to any changes in your symptoms. Follow these instructions to help with your condition: °Eating and drinking °· Eat well-balanced meals. Include foods that are high in iron, such as liver, meat, shellfish, green leafy vegetables, and eggs. °· If you become constipated: °? Drink plenty of water. °? Eat fruits and vegetables that are high in water and fiber, such as spinach, carrots, raspberries, apples, and mango. °Medicines °· Take over-the-counter and prescription medicines only as told by your health care provider. °· Do not change medicines without talking with your health care provider. °· Aspirin or medicines that contain aspirin may make the bleeding worse. Do not take those medicines: °? During the week before your period. °? During your period. °· If you were prescribed iron pills, take them as told by your health care provider. Iron pills help to replace iron that your body loses because of this condition. °Activity °· If you need to change your sanitary pad or tampon more than one time every 2 hours: °? Lie in bed with your feet raised (elevated). °? Place a cold pack on your lower abdomen. °? Rest as much as possible until the bleeding stops or slows down. °· Do not try to lose weight until the bleeding has stopped and your blood iron level is back to normal. °Other Instructions °· For two months, write down: °? When your period starts. °? When your period ends. °? When any abnormal bleeding occurs. °? What problems you notice. °· Keep all follow up visits as told by your health  care provider. This is important. °Contact a health care provider if: °· You get light-headed or weak. °· You have nausea and vomiting. °· You cannot eat or drink without vomiting. °· You feel dizzy or have diarrhea while you are taking medicines. °· You are taking birth control pills or hormones, and you want to change them or stop taking them. °Get help right away if: °· You develop a fever or chills. °· You need to change your sanitary pad or tampon more than one time per hour. °· Your bleeding becomes heavier, or your flow contains clots more often. °· You develop pain in your abdomen. °· You lose consciousness. °· You develop a rash. °This information is not intended to replace advice given to you by your health care provider. Make sure you discuss any questions you have with your health care provider. °Document Released: 07/24/2000 Document Revised: 01/02/2016 Document Reviewed: 10/22/2014 °Elsevier Interactive Patient Education © 2018 Elsevier Inc. ° °

## 2017-03-22 NOTE — MAU Note (Signed)
Chief Complaint: Vaginal Bleeding   None    SUBJECTIVE HPI: Leslie Ayers is a 26 y.o. [phone]  who presents to Maternity Admissions reporting heavy menses since her period has returned in April.  Pt had twins in February.  Pt states her LMP was 03/20/2017 and has change a maxi pad x 2 days and mod saturated pad x 4.  States periods are monthly lasting 5 days.  Prior to twins had monthly periods but lighter.  Had tubal ligation done with birth.  Denies concerns about her partner.  Denies medical problems.  States is dizzy when on her period.  Location: vaginal bleeding Quality: moderate Severity: declines pain medication Duration: 5 days Timing: Monthly Associated signs and symptoms:Some cramping with occ use of ibuprofen.    Past Medical History:  Diagnosis Date  . Anal fissure   . History of echocardiogram    Echo 11/17: EF 60-65, no RWMA  . Hx of chlamydia infection   . Vaginal Pap smear, abnormal    OB History  Gravida Para Term Preterm AB Living  4 2 2   2 3   SAB TAB Ectopic Multiple Live Births  2 0   1 2    # Outcome Date GA Lbr Len/2nd Weight Sex Delivery Anes PTL Lv  4A Term 09/12/16 [redacted]w[redacted]d 05:19 / 00:21 3.164 kg (6 lb 15.6 oz) M Vag-Spont EPI  LIV  4B Term 09/12/16 [redacted]w[redacted]d 05:19 / 01:14 3.19 kg (7 lb 0.5 oz) M CS-LTranv EPI  LIV  3 SAB 2014          2 Term 2011 [redacted]w[redacted]d  3.544 kg (7 lb 13 oz) F Vag-Spont EPI  LIV  1 SAB 2008             Past Surgical History:  Procedure Laterality Date  . CESAREAN SECTION N/A 09/12/2016   Procedure: CESAREAN SECTION;  Surgeon: Hoover Browns, MD;  Location: [facility];  Service: Obstetrics;  Laterality: N/A;  . CYST EXCISION Left 09/12/2016   Procedure: resection of left labial cyst;  Surgeon: Hoover Browns, MD;  Location: [facility]IRTHING SUIT[facility];  Service: Obstetrics;  Laterality: Left;  . 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 Marital status: Single    Spouse name: N/A  . Number of  children: N/A  . Years of education: N/A   Occupational History  . CNA    Social History Main Topics  . Smoking status: Never Smoker  . Smokeless tobacco: Never Used  . Alcohol use Yes     Comment: LAST DRANK-  LAST MTH  . Drug use: No  . Sexual activity: Yes    Birth control/ protection: None   Other Topics Concern  . Not on file   Social History Narrative   1 daughter - age 27   Pregnant with twins.   [employer] - assembly line work   Single    Native to [place]   Family History  Problem Relation Age of Onset  . Irritable bowel syndrome Mother   . Diabetes Mother   . Arrhythmia Maternal Grandmother   . Stroke Maternal Grandmother   . Diabetes Maternal Grandfather   . Stroke Maternal Uncle   . Colon cancer Neg Hx    No current facility-administered medications on file prior to encounter.    Current Outpatient Prescriptions on File Prior to Encounter  Medication Sig Dispense Refill  . ibuprofen (ADVIL,MOTRIN)  600 MG tablet Take 1 tablet (600 mg total) by mouth every 6 (six) hours. 30 tablet 0  . Cholecalciferol (VITAMIN D) 2000 units tablet Take 2 tablets (4,000 Units total) by mouth daily. 60 tablet 2  . ferrous sulfate 325 (65 FE) MG tablet Take 325 mg by mouth 2 (two) times daily.  1  . metroNIDAZOLE (FLAGYL) 500 MG tablet Take 1 tablet (500 mg total) by mouth 2 (two) times daily. 14 tablet 0  . Prenatal Vit-Fe Fumarate-FA (PRENATAL MULTIVITAMIN) TABS tablet Take 1 tablet by mouth daily at 12 noon.     Allergies  Allergen Reactions  . Penicillins Rash    Has patient had a Has patient had a PCN reaction that required hospitalization No Has patient had a PCN reaction occurring within the last 10 years: No   PCN reaction causing immediate rash, facial/tongue/throat swelling, SOB or lightheadedness with hypotension: No Has patient had a PCN reaction causing severe rash involving mucus membranes or skin necrosis: No If all of the above answers are "NO", then may  proceed with Cephalosporin use.     I have reviewed patient's Past Medical Hx, Surgical Hx, Family Hx, Social Hx, medications and allergies.   Review of Systems  Constitutional: Positive for fatigue.  HENT: Negative.   Eyes: Negative.   Respiratory: Negative.   Cardiovascular: Negative.   Gastrointestinal: Negative.   Endocrine: Negative.   Genitourinary: Negative.   Musculoskeletal: Negative.   Skin: Positive for pallor.  Allergic/Immunologic: Negative.   Neurological: Negative.   Hematological: Negative.   Psychiatric/Behavioral: Negative.     OBJECTIVE Patient Vitals for the past 24 hrs:  BP Temp Temp src Pulse Resp SpO2  03/22/17 1607 112/78 97.9 F (36.6 C) Oral 60 16 100 %   Constitutional: Well-developed, well-nourished female in no acute distress.  Cardiovascular: normal rate Respiratory: normal rate and effort.  GI: Abd soft, non-tender, gravid appropriate for gestational age. Pos BS x 4 MS: Extremities nontender, no edema, normal ROM Neurologic: Alert and oriented x 4.  GU: Neg CVAT.  SPECULUM EXAM: NEFG, physiologic discharge, small amount of  blood noted, cervix clean  BIMANUAL: cervix  parous with no lesions; uterus normal size, no adnexal tenderness or masses.  No CMT.  LAB RESULTS Results for orders placed or performed during the hospital encounter of 03/22/17 (from the past 24 hour(s))  Urinalysis, Routine w reflex microscopic     Status: Abnormal   Collection Time: 03/22/17  4:11 PM  Result Value Ref Range   Color, Urine RED (A) YELLOW   APPearance HAZY (A) CLEAR   Specific Gravity, Urine  1.005 - 1.030    TEST NOT REPORTED DUE TO COLOR INTERFERENCE OF URINE PIGMENT   pH  5.0 - 8.0    TEST NOT REPORTED DUE TO COLOR INTERFERENCE OF URINE PIGMENT   Glucose, UA (A) NEGATIVE mg/dL    TEST NOT REPORTED DUE TO COLOR INTERFERENCE OF URINE PIGMENT   Hgb urine dipstick (A) NEGATIVE    TEST NOT REPORTED DUE TO COLOR INTERFERENCE OF URINE PIGMENT    Bilirubin Urine (A) NEGATIVE    TEST NOT REPORTED DUE TO COLOR INTERFERENCE OF URINE PIGMENT   Ketones, ur (A) NEGATIVE mg/dL    TEST NOT REPORTED DUE TO COLOR INTERFERENCE OF URINE PIGMENT   Protein, ur (A) NEGATIVE mg/dL    TEST NOT REPORTED DUE TO COLOR INTERFERENCE OF URINE PIGMENT   Nitrite (A) NEGATIVE    TEST NOT REPORTED DUE TO COLOR INTERFERENCE OF  URINE PIGMENT   Leukocytes, UA (A) NEGATIVE    TEST NOT REPORTED DUE TO COLOR INTERFERENCE OF URINE PIGMENT  Urinalysis, Microscopic (reflex)     Status: Abnormal   Collection Time: 03/22/17  4:11 PM  Result Value Ref Range   RBC / HPF TOO NUMEROUS TO COUNT 0 - 5 RBC/hpf   WBC, UA 0-5 0 - 5 WBC/hpf   Bacteria, UA FEW (A) NONE SEEN   Squamous Epithelial / LPF 0-5 (A) NONE SEEN   Mucous PRESENT   Pregnancy, urine POC     Status: None   Collection Time: 03/22/17  4:19 PM  Result Value Ref Range   Preg Test, Ur NEGATIVE NEGATIVE   HH 12.5/39.1 plt 218 IMAGING No results found.  MAU COURSE Orders Placed This Encounter  Procedures  . Wet prep, genital  . Urinalysis, Routine w reflex microscopic  . Urinalysis, Microscopic (reflex)  . CBC  . Pregnancy, urine POC     MDM Labs, pelvic ASSESSMENT Dysfunctional uterine bleeding.  PLAN Discussed dysfunctional uterine bleeding and treatment options.  Discussed obtaining a pelvic US at [facility] and [facility].  Wet mount negative.  CBC wnl. Discharge home in stable condition.       Kenney Housemanrothero, Nancy Jean, CNM 03/22/2017  10:24 PM

## 2017-03-22 NOTE — MAU Note (Signed)
Pt states she had twins in February, every period has been very heavy since then.  Has been dizzy during her last two periods.  Saturating a pad every hour.  Having lower abd cramping.

## 2017-03-24 LAB — GC/CHLAMYDIA PROBE AMP (~~LOC~~) NOT AT ARMC
Chlamydia: NEGATIVE
Neisseria Gonorrhea: NEGATIVE

## 2017-04-30 IMAGING — US US OB COMP LESS 14 WK
1 series · 15 of 28 positions shown · non-contrast
Comparison: None.

CLINICAL DATA: Vaginal bleeding.

EXAM:
TWIN OBSTETRIC <14WK US AND TRANSVAGINAL OB US

[Series 1: us ob comp less 14 wk · 52 acquisitions, 15 frames shown]
[im 1/52]
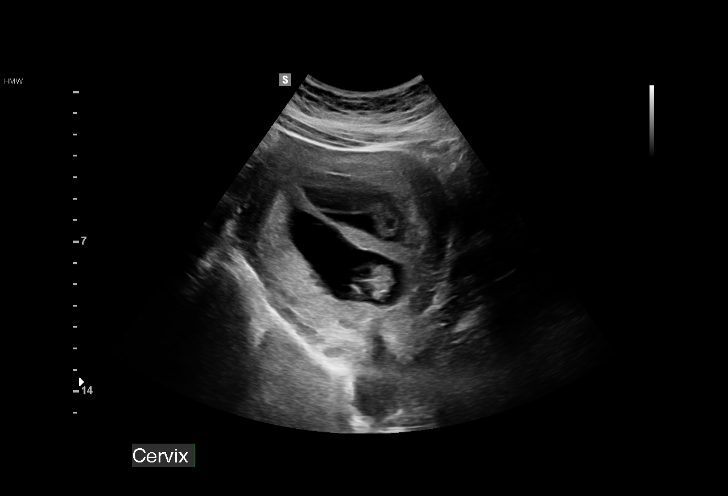
[im 4/52]
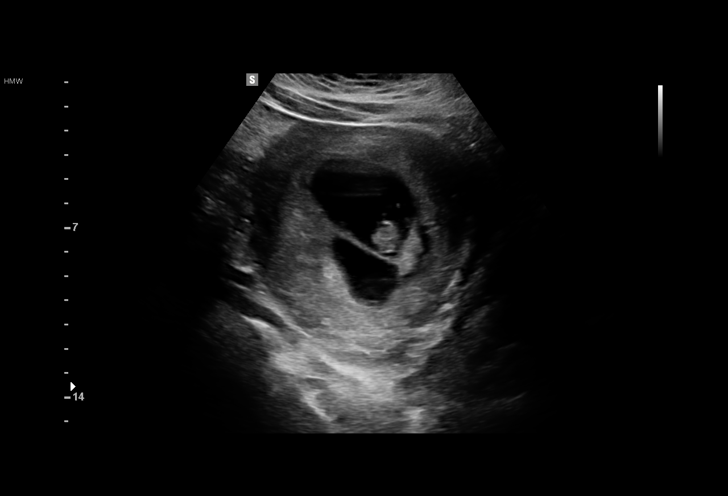
[im 8/52]
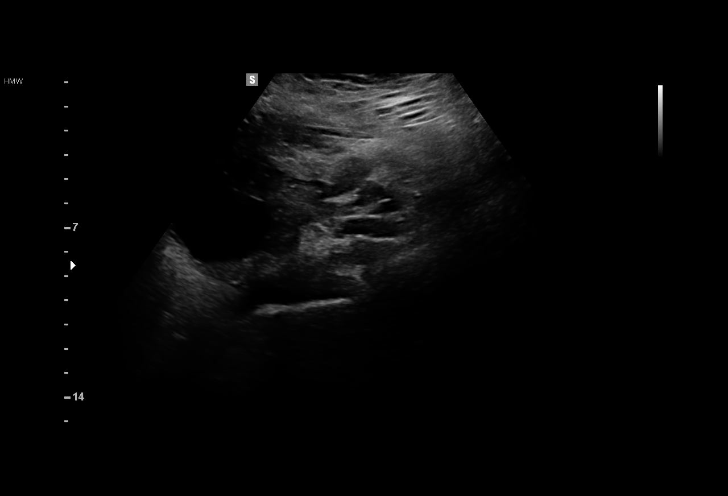
[im 12/52]
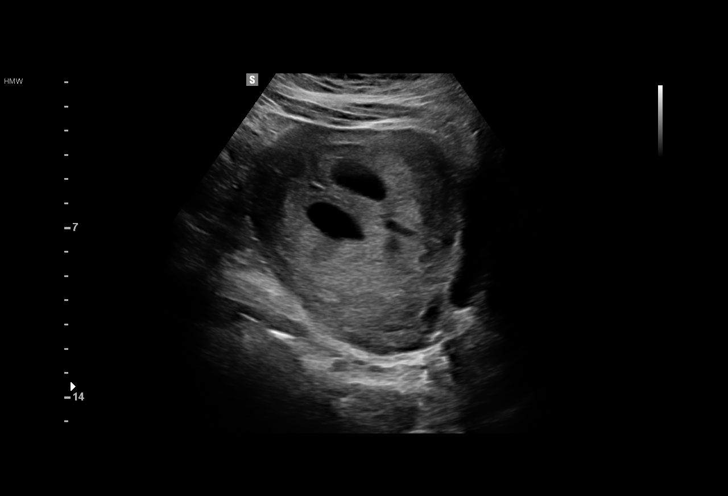
[im 16/52]
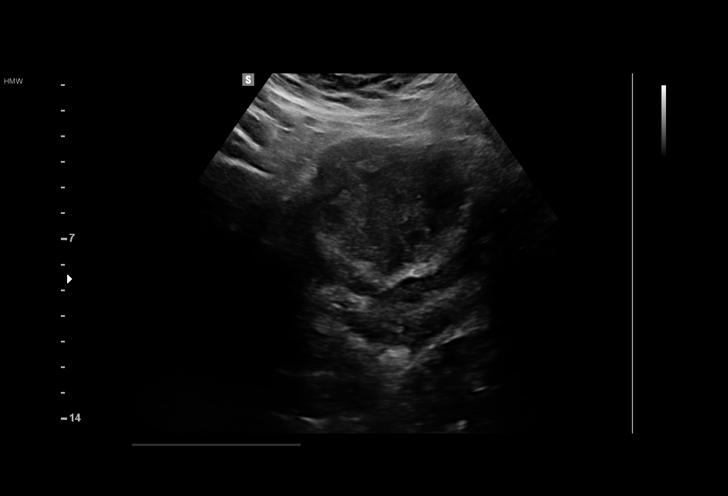
[im 19/52]
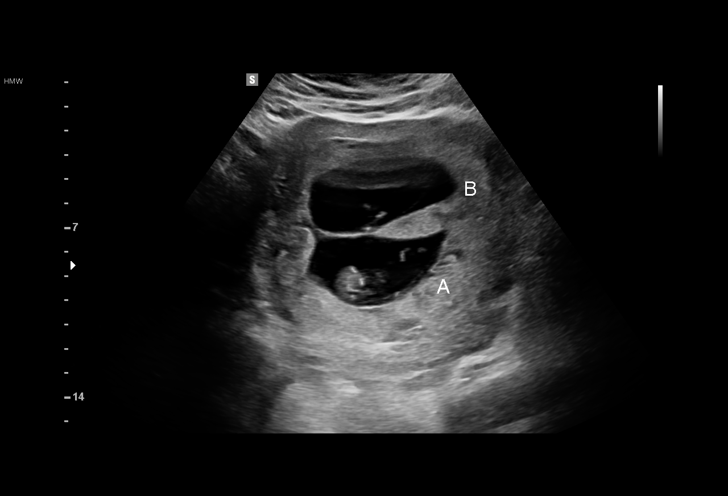
[im 23/52]
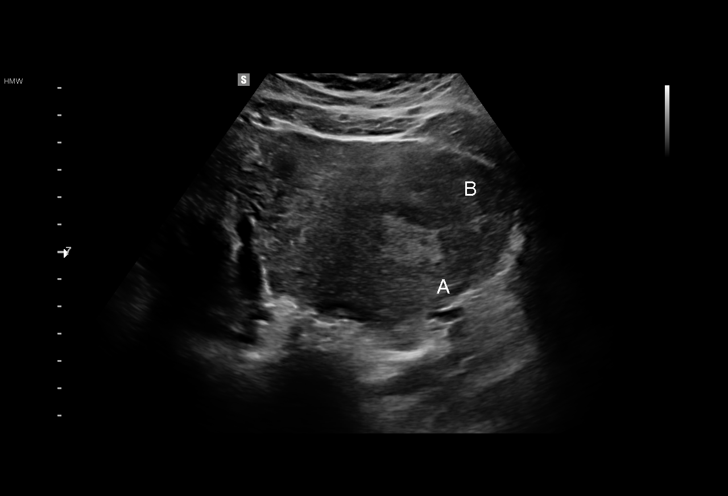
[im 27/52]
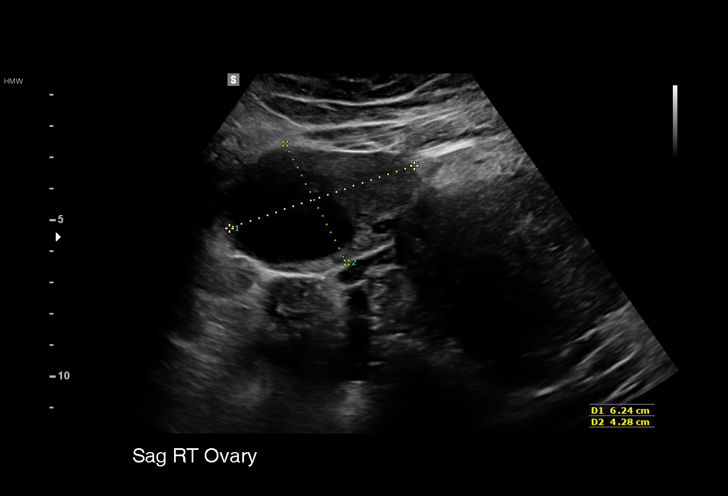
[im 29/52]
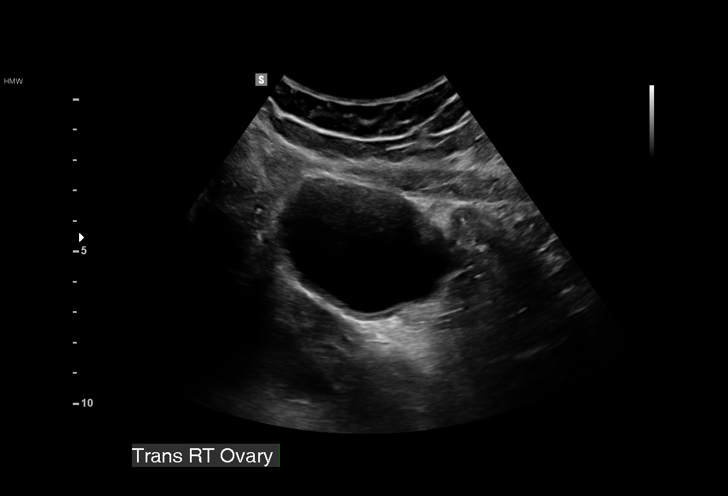
[im 33/52]
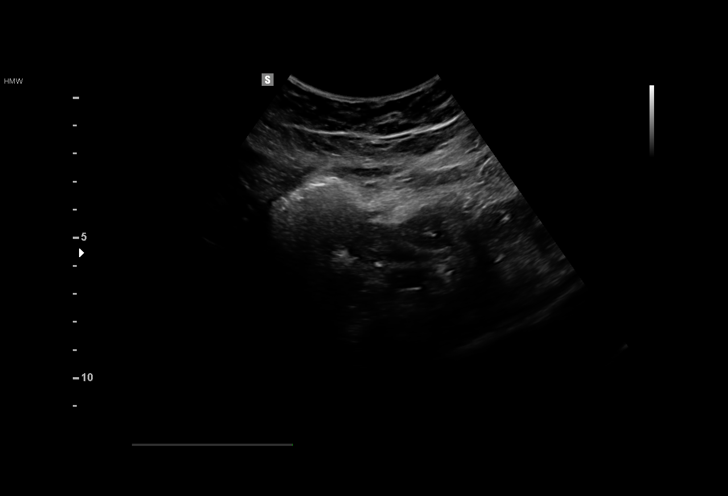
[im 36/52]
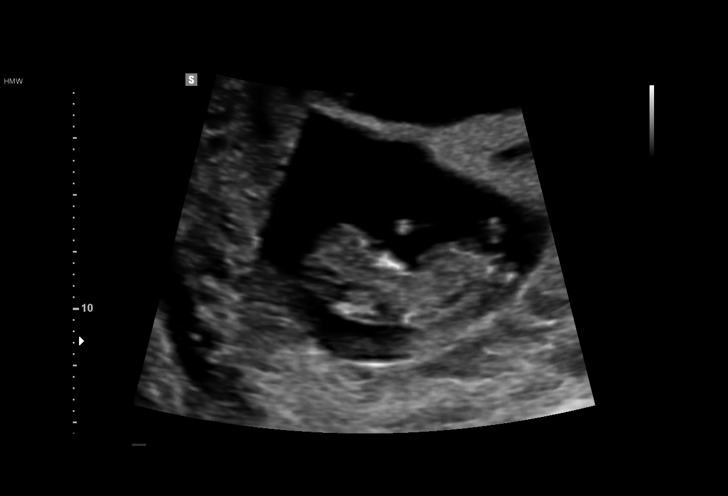
[im 40/52]
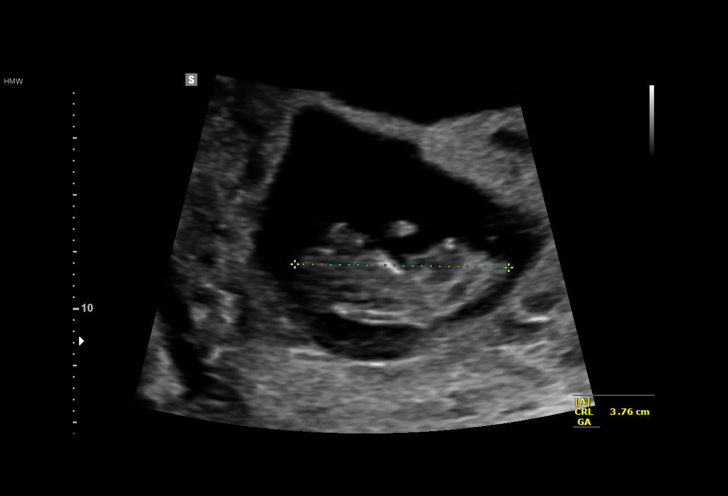
[im 44/52]
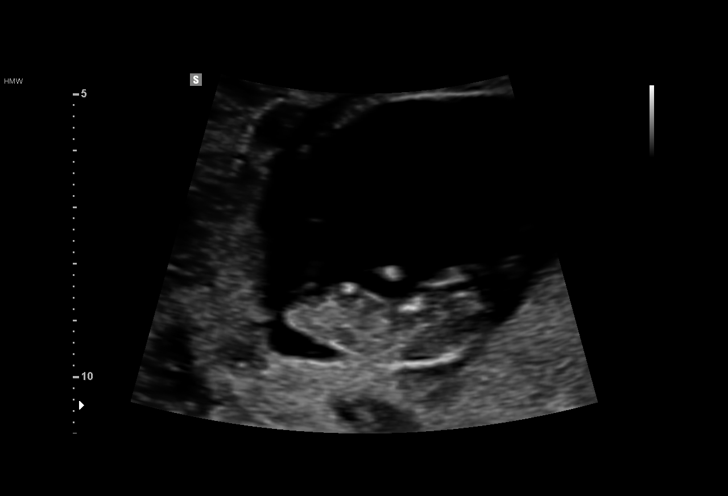
[im 48/52]
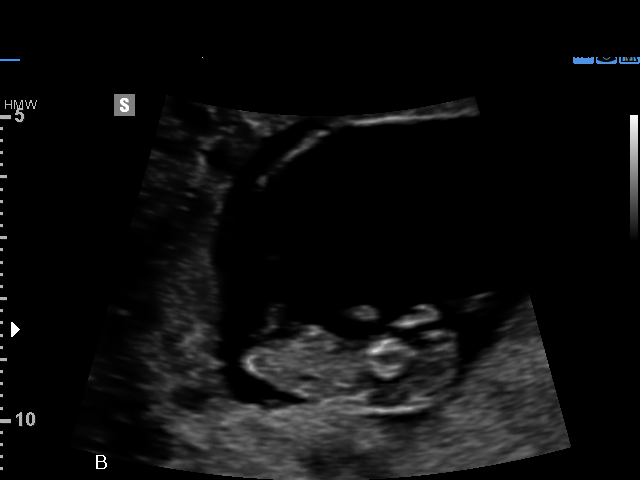
[im 52/52]
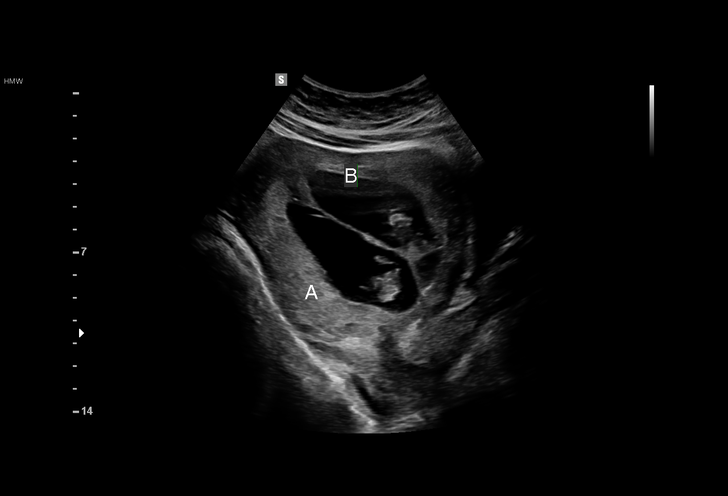

[15 of 28 positions shown; findings below may reference images not displayed]

FINDINGS: Number of IUPs:  2

Chorionicity/Amnionicity:  Dichorionic and diamniotic

TWIN 1

Yolk sac:  No

Embryo:  Present

Cardiac Activity: Present

Heart Rate: 164 bpm

CRL:  37.8  mm   10 w 5 d                  US EDC: 09/18/2016

TWIN 2

Yolk sac:  Present

Embryo:  Present

Cardiac Activity: Present

Heart Rate: 168 bpm

CRL:  36.5  mm   10 w 4 d                  US EDC: 09/19/2016

Subchorionic hemorrhage:  Small subchorionic hemorrhage.

Maternal uterus/adnexae: 4.3 x 3.8 x 5.4 cm anechoic right ovarian
mass most consistent with a cyst. Not visualized left ovary. No
pelvic free fluid.
IMPRESSION: 1. Twin live intrauterine gestations as described above.
2. Small subchorionic hemorrhage.

## 2017-11-15 IMAGING — US US MFM OB LIMITED
1 series · 15 of 23 positions shown · non-contrast
Comparison: none

[Series 1: us mfm ob limited · 15 of 23 slices shown]
[im 1/23]
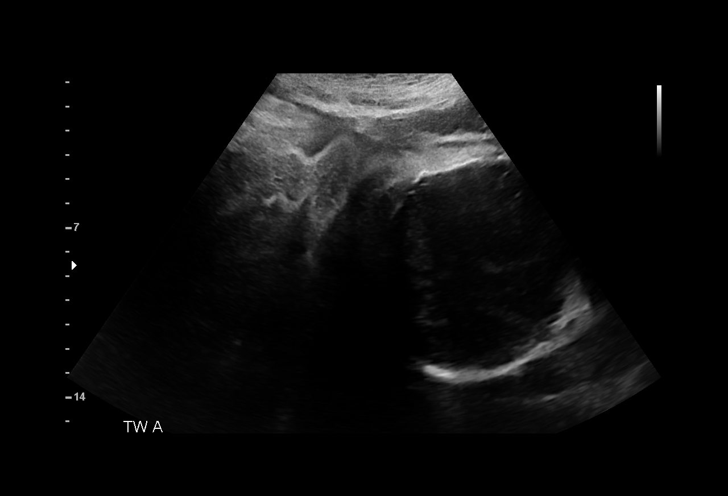
[im 3/23]
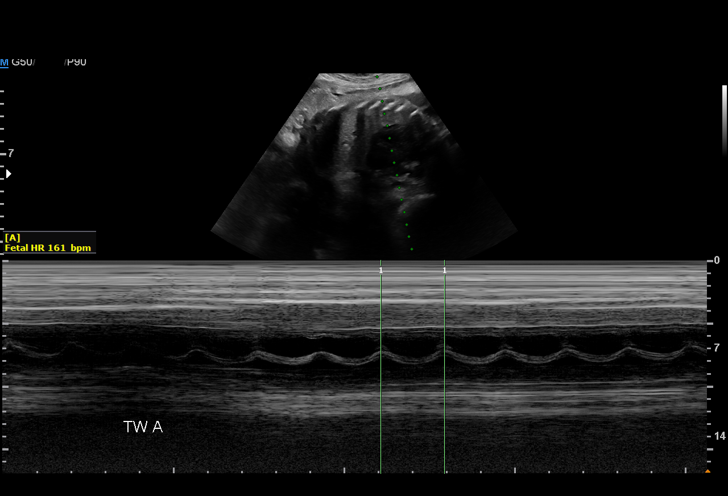
[im 4/23]
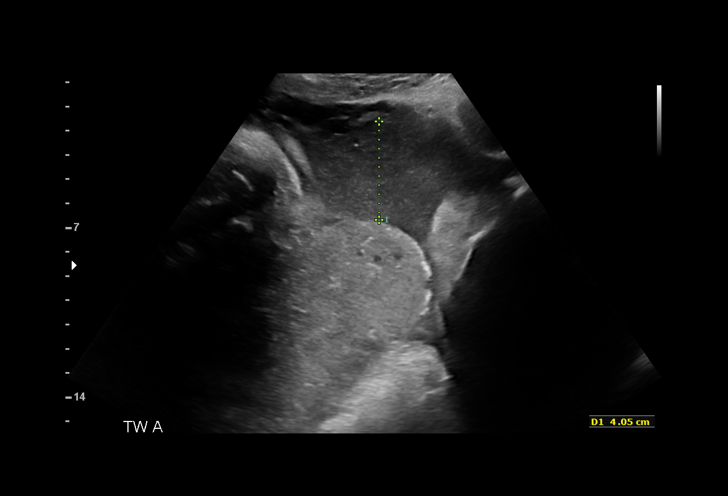
[im 6/23]
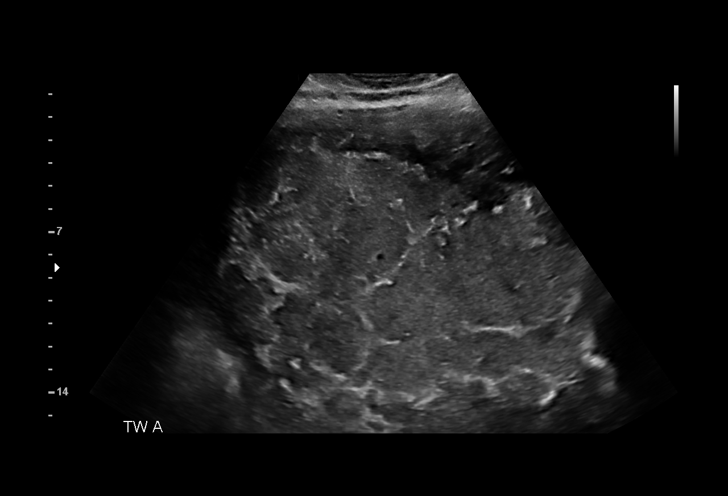
[im 7/23]
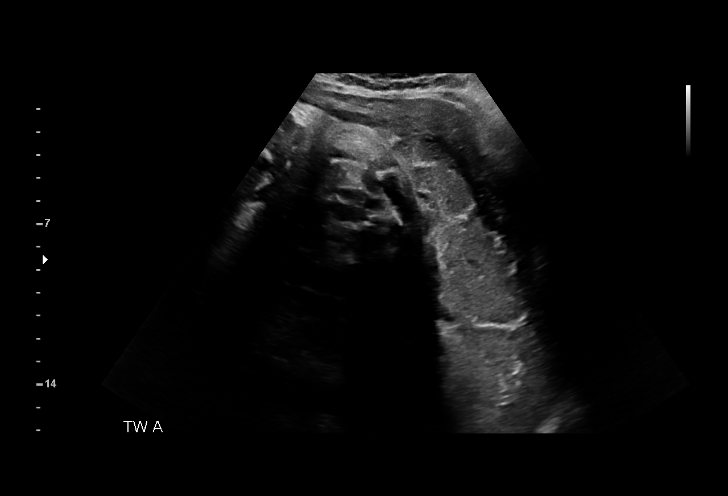
[im 9/23]
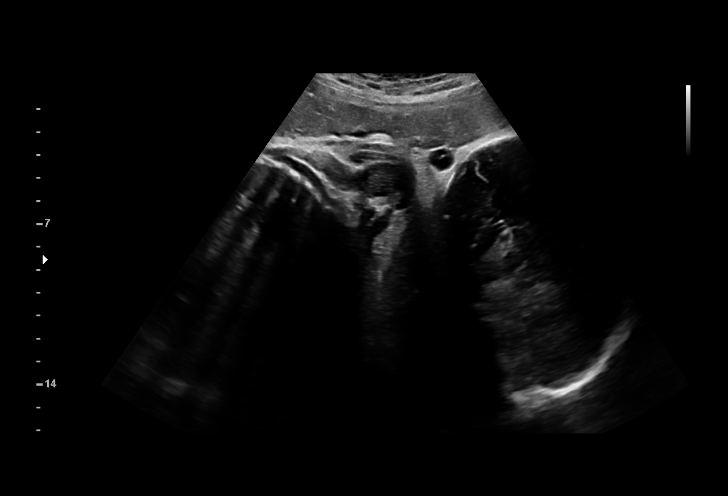
[im 10/23]
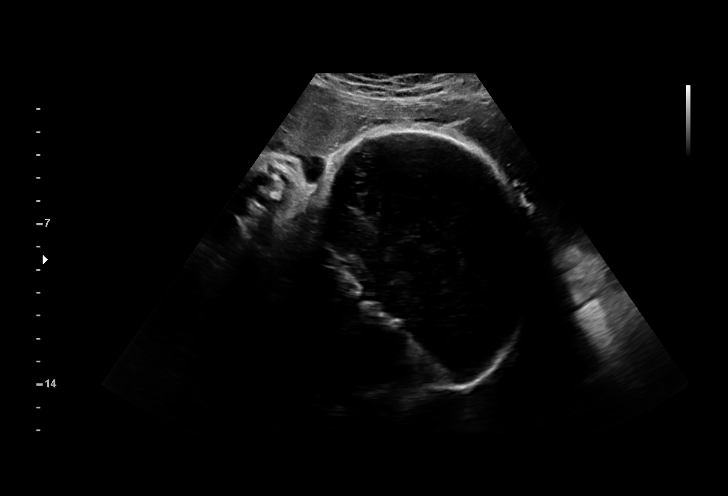
[im 12/23]
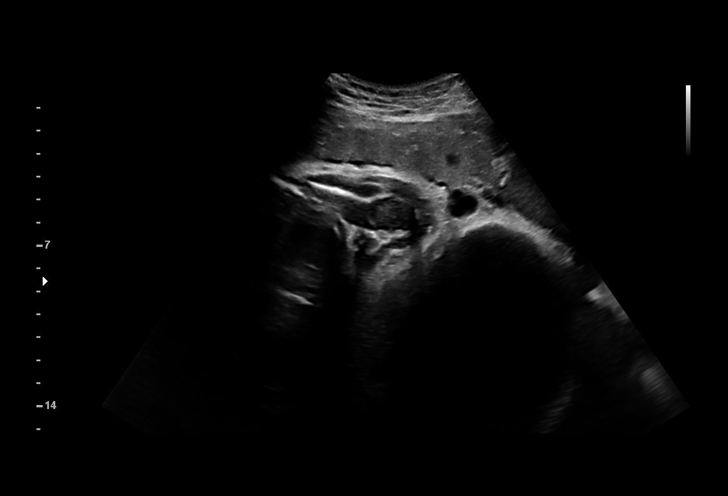
[im 14/23]
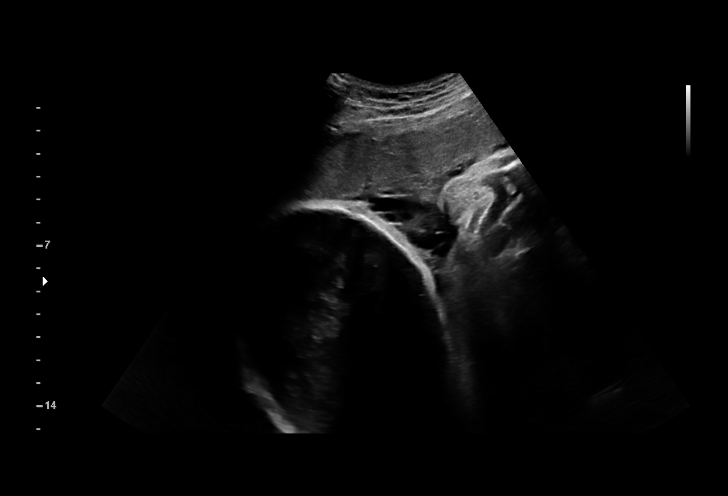
[im 15/23]
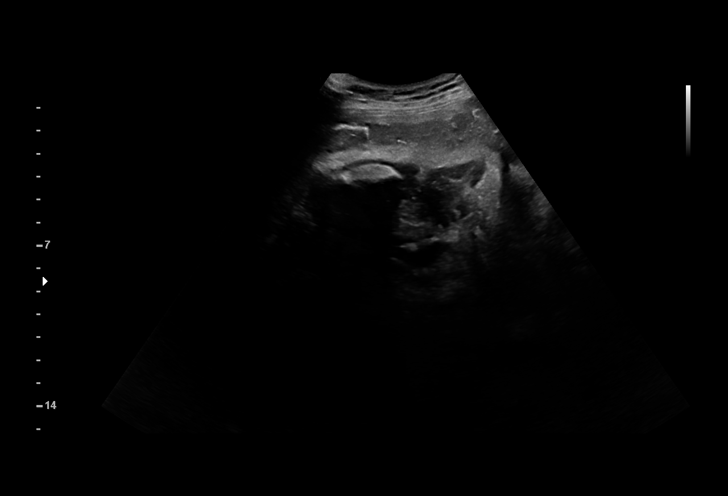
[im 17/23]
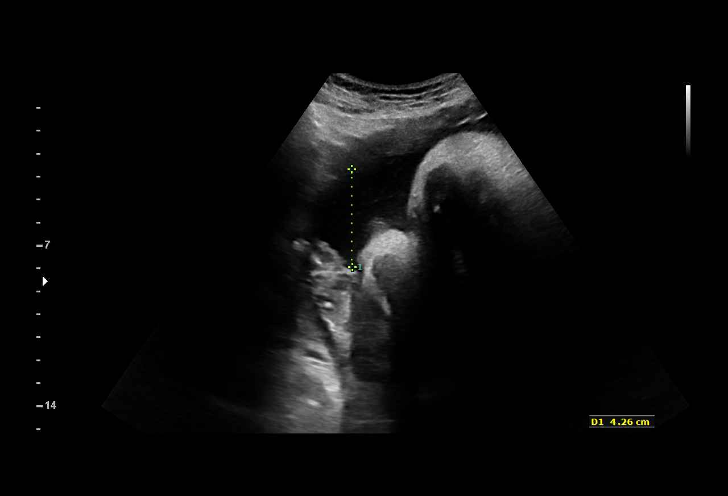
[im 18/23]
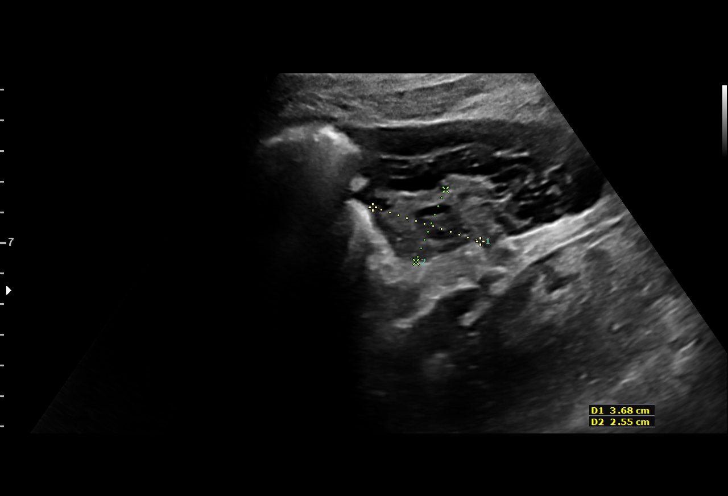
[im 20/23]
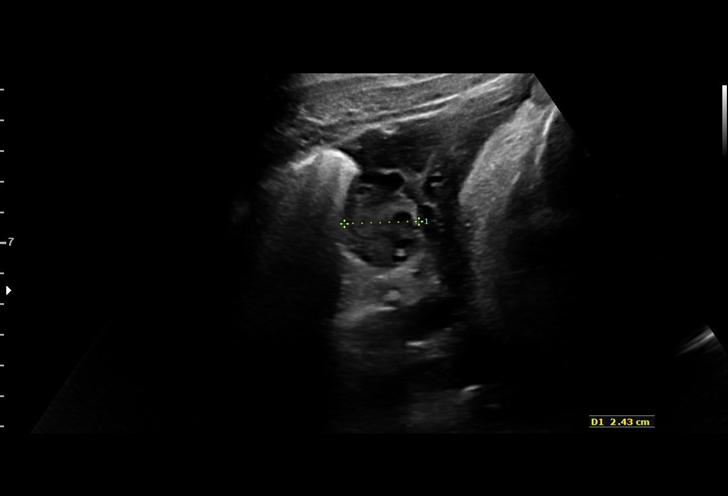
[im 21/23]
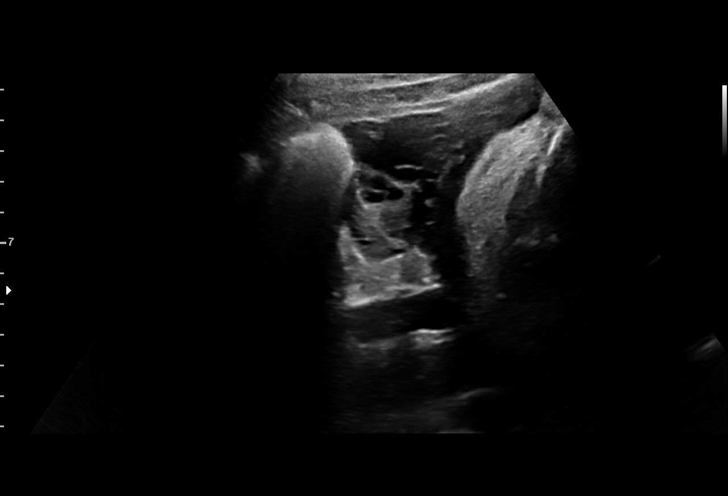
[im 23/23]
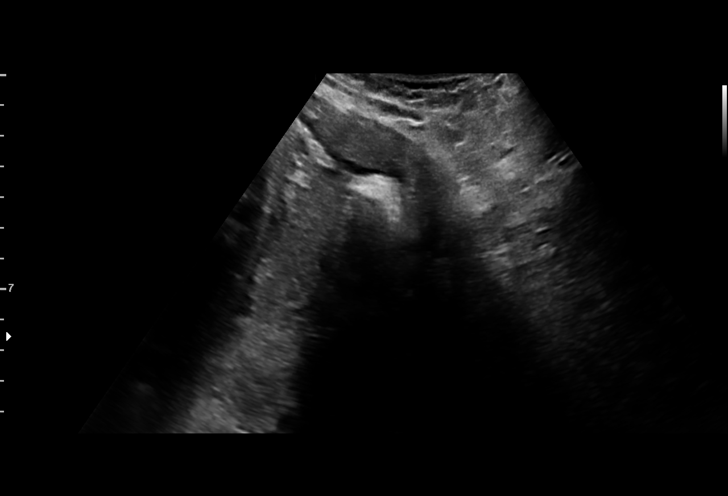

[15 of 23 positions shown; findings below may reference images not displayed]

[REDACTED]7

1  ABDULMANNAN NAHHAS             [id number]      [id number]     [id number]
Indications

38 weeks gestation of pregnancy
Determine Fetal presentation by ultrasound
Twin pregnancy, di/di, third trimester
OB History

Gravidity:    4         Term:   1         SAB:   2
Fetal Evaluation (Fetus A)

Num Of Fetuses:     2
Fetal Heart         161
Rate(bpm):
Cardiac Activity:   Observed
Fetal Lie:          Left Fetus
Presentation:       Cephalic
Placenta:           Left lateral, above cervical os

Amniotic Fluid
AFI FV:      Subjectively low-normal

Largest Pocket(cm)
4
Gestational Age (Fetus A)

Clinical EDD:  38w 4d                                        EDD:   09/22/16
Best:          38w 4d     Det. By:  Clinical EDD             EDD:   09/22/16
Fetal Evaluation (Fetus B)
Num Of Fetuses:     2
Fetal Heart         151
Rate(bpm):
Cardiac Activity:   Observed
Fetal Lie:          Right Fetus
Presentation:       Cephalic
Placenta:           Anterior, above cervical os

Amniotic Fluid
AFI FV:      Subjectively low-normal

Largest Pocket(cm)
4.2
Gestational Age (Fetus B)

Clinical EDD:  38w 4d                                        EDD:   09/22/16
Best:          38w 4d     Det. By:  Clinical EDD             EDD:   09/22/16
Cervix Uterus Adnexa

Cervix
Not visualized (advanced GA >28wks)

Left Ovary
Not visualized.

Right Ovary
Within normal limits.
Impression

Dichorionic/diamniotic twin pregnancy at 38+4 weeks
Cephalic/cephalic presentation
Low normal amniotic fluid volume x 2

Recommendations

Follow-up as clinically indicated

## 2019-08-24 ENCOUNTER — Ambulatory Visit: Payer: BC Managed Care – PPO | Attending: Internal Medicine

## 2019-08-24 DIAGNOSIS — Z20822 Contact with and (suspected) exposure to covid-19: Secondary | ICD-10-CM

## 2019-08-25 LAB — NOVEL CORONAVIRUS, NAA: SARS-CoV-2, NAA: NOT DETECTED

## 2020-12-03 ENCOUNTER — Emergency Department (HOSPITAL_COMMUNITY)
Admission: EM | Admit: 2020-12-03 | Discharge: 2020-12-03 | Disposition: A | Discharge status: Home / Self Care | Payer: BC Managed Care – PPO | Attending: Emergency Medicine | Admitting: Emergency Medicine

## 2020-12-03 ENCOUNTER — Encounter (HOSPITAL_COMMUNITY): Payer: Self-pay | Admitting: *Deleted

## 2020-12-03 DIAGNOSIS — S21219D Laceration without foreign body of unspecified back wall of thorax without penetration into thoracic cavity, subsequent encounter: Secondary | ICD-10-CM | POA: Insufficient documentation

## 2020-12-03 DIAGNOSIS — Z4802 Encounter for removal of sutures: Secondary | ICD-10-CM | POA: Diagnosis not present

## 2020-12-03 DIAGNOSIS — Z4803 Encounter for change or removal of drains: Secondary | ICD-10-CM | POA: Insufficient documentation

## 2020-12-03 DIAGNOSIS — L539 Erythematous condition, unspecified: Secondary | ICD-10-CM | POA: Diagnosis not present

## 2020-12-03 DIAGNOSIS — X58XXXD Exposure to other specified factors, subsequent encounter: Secondary | ICD-10-CM | POA: Insufficient documentation

## 2020-12-03 NOTE — Discharge Instructions (Addendum)
Please watch for signs of infection including worsening redness, swelling, drainage, fevers, chills or any other new or concerning symptoms.  Please make sure to follow-up with your surgeon.  Return to the ER for any new or worsening symptoms.

## 2020-12-03 NOTE — ED Notes (Signed)
An After Visit Summary was printed and given to the patient. Discharge instructions given and no further questions at this time.  

## 2020-12-03 NOTE — ED Provider Notes (Signed)
[facility]-EMERGENCY DEPT Provider Note   CSN: [id number] Arrival date & time: 12/03/20  1303     History Chief Complaint  Patient presents with  . needs drains removed    Leslie Ayers is a 30 y.o. female.  HPI 30 year old female here requesting to have her drain removed and some sutures removed to her posterior back after receiving a [place] butt lift on 4/14.  Denies any complications, no fevers, chills, no pain.  Reports very little output in drain output over the last few days.  She was told to wait a week and have these removed.    Past Medical History:  Diagnosis Date  . Anal fissure   . History of echocardiogram    Echo 11/17: EF 60-65, no RWMA  . Hx of chlamydia infection   . Vaginal Pap smear, abnormal     Patient Active Problem List   Diagnosis Date Noted  . Penicillin allergy 09/12/2016  . Depressive disorder 09/12/2016    Past Surgical History:  Procedure Laterality Date  . CESAREAN SECTION N/A 09/12/2016   Procedure: CESAREAN SECTION;  Surgeon: Hoover Browns, MD;  Location: [facility];  Service: Obstetrics;  Laterality: N/A;  . CYST EXCISION Left 09/12/2016   Procedure: resection of left labial cyst;  Surgeon: Hoover Browns, MD;  Location: [facility];  Service: Obstetrics;  Laterality: Left;  . 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 Irritable bowel syndrome Mother   . Diabetes Mother   . Arrhythmia Maternal Grandmother   . Stroke Maternal Grandmother   . Diabetes Maternal Grandfather   . Stroke Maternal Uncle   . Colon cancer Neg Hx     Social History   Tobacco Use  . Smoking status: Never Smoker  . Smokeless tobacco: Never Used  Substance Use Topics  . Alcohol use: Yes     Comment: LAST DRANK-  LAST MTH  . Drug use: No    Home Medications Prior to Admission medications   Medication Sig Start Date End Date Taking? Authorizing Provider  Cholecalciferol (VITAMIN D) 2000 units tablet Take 2 tablets (4,000 Units total) by mouth daily. 05/25/16   Rasch, Victorino Dike I, NP  ferrous sulfate 325 (65 FE) MG tablet Take 325 mg by mouth 2 (two) times daily. 06/04/16   [provider]  ibuprofen (ADVIL,MOTRIN) 600 MG tablet Take 1 tablet (600 mg total) by mouth every 6 (six) hours. 09/15/16   Prothero, Henderson Newcomer, CNM  metroNIDAZOLE (FLAGYL) 500 MG tablet Take 1 tablet (500 mg total) by mouth 2 (two) times daily. 02/02/17   Gerrit Heck, CNM  Prenatal Vit-Fe Fumarate-FA (PRENATAL MULTIVITAMIN) TABS tablet Take 1 tablet by mouth daily at 12 noon.    [provider]    Allergies    Penicillins  Review of Systems   Review of Systems  Skin: Negative for color change and wound.    Physical Exam Updated Vital Signs BP (!) 141/82  Pulse 92   Temp 98.3 F (36.8 C) (Oral)   Resp 18   Ht 5\' 6"  (1.676 m)   Wt 87.5 kg   LMP 11/13/2020   SpO2 99%   BMI 31.15 kg/m   Physical Exam Vitals and nursing note reviewed.  Constitutional:      General: She is not in acute distress.    Appearance: She is well-developed.  HENT:     Head: Normocephalic and atraumatic.  Eyes:     Conjunctiva/sclera: Conjunctivae normal.  Cardiovascular:     Rate and Rhythm: Normal rate and regular rhythm.     Heart sounds: No murmur heard.   Pulmonary:     Effort: Pulmonary effort is normal. No respiratory distress.     Breath sounds: Normal breath sounds.  Abdominal:     Palpations: Abdomen is soft.     Tenderness: There is no abdominal tenderness.  Musculoskeletal:     Cervical back: Neck supple.  Skin:    General: Skin is warm and dry.     Findings: Erythema present.     Comments: Drain to the left lower abdomen with no surrounding erythema, drainage, warmth.  Very  little amount of serosanguineous fluid in the JP drain.  3 incisions to the lumbar area without evidence of infection, with 3 sutures in place.  No evidence of dehiscence.  Neurological:     Mental Status: She is alert.     ED Results / Procedures / Treatments   Labs (all labs ordered are listed, but only abnormal results are displayed) Labs Reviewed - No data to display  EKG None  Radiology No results found.  Procedures Drain removal  Date/Time: 12/03/2020 3:13 PM Performed by: 12/05/2020, PA-C Authorized by: Mare Ferrari, PA-C  Consent: Verbal consent obtained. Consent given by: patient Patient understanding: patient states understanding of the procedure being performed Patient consent: the patient's understanding of the procedure matches consent given Procedure consent: procedure consent matches procedure scheduled Relevant documents: relevant documents present and verified Test results: test results available and properly labeled Site marked: the operative site was marked Imaging studies: imaging studies available Patient identity confirmed: verbally with patient Local anesthesia used: no  Anesthesia: Local anesthesia used: no  Sedation: Patient sedated: no  Patient tolerance: patient tolerated the procedure well with no immediate complications  .Suture Removal  Date/Time: 12/03/2020 3:13 PM Performed by: 12/05/2020, PA-C Authorized by: Mare Ferrari, PA-C   Consent:    Consent obtained:  Verbal   Consent given by:  Patient   Risks discussed:  Bleeding and pain   Alternatives discussed:  No treatment Universal protocol:    Patient identity confirmed:  Verbally with patient Location:    Location:  Trunk   Trunk location:  Back Procedure details:    Wound appearance:  No signs of infection   Number of sutures removed:  3 Post-procedure details:    Post-removal:  No dressing applied   Procedure completion:  Tolerated well, no immediate  complications     Medications Ordered in ED Medications - No data to display  ED Course  I have reviewed the triage vital signs and the nursing notes.  Pertinent labs & imaging results that were available during my care of the patient were reviewed by me and considered in my medical decision making (see chart for details).    MDM Rules/Calculators/A&P  30 year old female with request for drain removal and suture removal after a [place] butt lift.  No signs of infection.  Drain with minimal output.  Posterior incisions without signs of infection.  Patient tolerated removal well.  Educated on signs of infection and return precautions.  She plans to follow-up with her surgeon in 2 weeks.  Stable for discharge. Final Clinical Impression(s) / ED Diagnoses Final diagnoses:  Visit for suture removal  Encounter for change or removal of drains    Rx / DC Orders ED Discharge Orders    None       Leone Brand 12/03/20 1514    Gerhard Munch, MD 12/03/20 1645

## 2020-12-03 NOTE — ED Triage Notes (Signed)
Emergency Medicine Provider Triage Evaluation Note  Leslie Ayers , a 30 y.o. female  was evaluated in triage.  Pt complains of needing a drain removed and some stitches taken out after [place] butt lift on 4/14.  Denies any complications, no fevers, chills, increasing pain  Review of Systems  Positive: As above Negative: As above  Physical Exam  BP (!) 142/92 (BP Location: Left Arm)   Pulse (!) 108   Temp 98.3 F (36.8 C) (Oral)   Resp 18   Ht 5\' 6"  (1.676 m)   Wt 87.5 kg   LMP 11/13/2020   SpO2 100%   BMI 31.15 kg/m  Gen:   Awake, no distress   HEENT:  Atraumatic  Resp:  Normal effort  Cardiac:  Normal rate  Abd:   Nondistended, nontender  MSK:   Moves extremities without difficulty  Neuro:  Speech clear   Medical Decision Making  Medically screening exam initiated at 1:33 PM.  Appropriate orders placed.  JOSALYNN JOHNDROW was informed that the remainder of the evaluation will be completed by another provider, this initial triage assessment does not replace that evaluation, and the importance of remaining in the ED until their evaluation is complete.  Clinical Impression  Stable for evaluation by provider   Christy Sartorius, PA-C 12/03/20 1334

## 2020-12-03 NOTE — ED Triage Notes (Signed)
Pt had [place] butt lift 4/14 in [place] and had drains placed. She would like drains and stitches removed. She was told to wait 7 days and have ED take out drains.

## 2022-05-03 DIAGNOSIS — Z6829 Body mass index (BMI) 29.0-29.9, adult: Secondary | ICD-10-CM | POA: Diagnosis not present

## 2022-05-03 DIAGNOSIS — H66001 Acute suppurative otitis media without spontaneous rupture of ear drum, right ear: Secondary | ICD-10-CM | POA: Diagnosis not present

## 2022-07-08 ENCOUNTER — Other Ambulatory Visit: Payer: Self-pay | Admitting: Otolaryngology

## 2022-07-08 DIAGNOSIS — H903 Sensorineural hearing loss, bilateral: Secondary | ICD-10-CM | POA: Diagnosis not present

## 2022-07-08 DIAGNOSIS — H93A1 Pulsatile tinnitus, right ear: Secondary | ICD-10-CM | POA: Diagnosis not present

## 2022-08-24 DIAGNOSIS — R87613 High grade squamous intraepithelial lesion on cytologic smear of cervix (HGSIL): Secondary | ICD-10-CM | POA: Diagnosis not present

## 2022-08-24 DIAGNOSIS — E049 Nontoxic goiter, unspecified: Secondary | ICD-10-CM | POA: Diagnosis not present

## 2022-08-24 DIAGNOSIS — Z124 Encounter for screening for malignant neoplasm of cervix: Secondary | ICD-10-CM | POA: Diagnosis not present

## 2022-08-24 DIAGNOSIS — Z113 Encounter for screening for infections with a predominantly sexual mode of transmission: Secondary | ICD-10-CM | POA: Diagnosis not present

## 2022-09-02 ENCOUNTER — Ambulatory Visit
Admission: RE | Admit: 2022-09-02 | Discharge: 2022-09-02 | Disposition: A | Discharge status: Home / Self Care | Payer: Medicaid Other | Source: Ambulatory Visit | Attending: Otolaryngology

## 2022-09-02 DIAGNOSIS — H93A1 Pulsatile tinnitus, right ear: Secondary | ICD-10-CM | POA: Diagnosis not present

## 2022-09-17 ENCOUNTER — Ambulatory Visit: Payer: Medicaid Other | Admitting: Family Medicine

## 2022-09-17 ENCOUNTER — Encounter: Payer: Self-pay | Admitting: Family Medicine

## 2022-09-17 VITALS — BP 112/62 | HR 78 | Temp 98.9°F | Ht 66.0 in | Wt 202.2 lb

## 2022-09-17 DIAGNOSIS — H93A1 Pulsatile tinnitus, right ear: Secondary | ICD-10-CM

## 2022-09-17 DIAGNOSIS — F430 Acute stress reaction: Secondary | ICD-10-CM | POA: Diagnosis not present

## 2022-09-17 DIAGNOSIS — E010 Iodine-deficiency related diffuse (endemic) goiter: Secondary | ICD-10-CM

## 2022-09-17 LAB — CBC WITH DIFFERENTIAL/PLATELET
Basophils Absolute: 0 10*3/uL (ref 0.0–0.1)
Basophils Relative: 0.8 % (ref 0.0–3.0)
Eosinophils Absolute: 0 10*3/uL (ref 0.0–0.7)
Eosinophils Relative: 0.4 % (ref 0.0–5.0)
HCT: 40.3 % (ref 36.0–46.0)
Hemoglobin: 13.7 g/dL (ref 12.0–15.0)
Lymphocytes Relative: 43.3 % (ref 12.0–46.0)
Lymphs Abs: 1.7 10*3/uL (ref 0.7–4.0)
MCHC: 34.1 g/dL (ref 30.0–36.0)
MCV: 88.7 fl (ref 78.0–100.0)
Monocytes Absolute: 0.3 10*3/uL (ref 0.1–1.0)
Monocytes Relative: 6.7 % (ref 3.0–12.0)
Neutro Abs: 2 10*3/uL (ref 1.4–7.7)
Neutrophils Relative %: 48.8 % (ref 43.0–77.0)
Platelets: 221 10*3/uL (ref 150.0–400.0)
RBC: 4.54 Mil/uL (ref 3.87–5.11)
RDW: 12.7 % (ref 11.5–15.5)
WBC: 4 10*3/uL (ref 4.0–10.5)

## 2022-09-17 LAB — COMPREHENSIVE METABOLIC PANEL
ALT: 10 U/L (ref 0–35)
AST: 12 U/L (ref 0–37)
Albumin: 4.5 g/dL (ref 3.5–5.2)
Alkaline Phosphatase: 64 U/L (ref 39–117)
BUN: 9 mg/dL (ref 6–23)
CO2: 23 mEq/L (ref 19–32)
Calcium: 9.4 mg/dL (ref 8.4–10.5)
Chloride: 106 mEq/L (ref 96–112)
Creatinine, Ser: 0.65 mg/dL (ref 0.40–1.20)
GFR: 116.85 mL/min (ref >60.00)
Glucose, Bld: 79 mg/dL (ref 70–99)
Potassium: 3.7 mEq/L (ref 3.5–5.1)
Sodium: 140 mEq/L (ref 135–145)
Total Bilirubin: 0.6 mg/dL (ref 0.2–1.2)
Total Protein: 7.3 g/dL (ref 6.0–8.3)

## 2022-09-17 LAB — HEMOGLOBIN A1C: Hgb A1c MFr Bld: 5.6 % (ref 4.6–6.5)

## 2022-09-17 LAB — TSH: TSH: 0.88 u[IU]/mL (ref 0.35–5.50)

## 2022-09-17 LAB — T4, FREE: Free T4: 0.95 ng/dL (ref 0.60–1.60)

## 2022-09-17 LAB — T3, FREE: T3, Free: 3.7 pg/mL (ref 2.3–4.2)

## 2022-09-17 NOTE — Patient Instructions (Addendum)
Welcome to [facility]! It was a pleasure meeting you today.  As discussed, Please schedule a 1 month follow up visit today.  [phone] for mammogram, other studies   PLEASE NOTE:  If you had any LAB tests please let us know if you have not heard back within a few days. You may see your results on MyChart before we have a chance to review them but we will give you a call once they are reviewed by [place]. If we ordered any REFERRALS today, please let us know if you have not heard from their office within the next week.  Let us know through MyChart if you are needing REFILLS, or have your pharmacy send [place] the request. You can also use MyChart to communicate with me or any office staff.  Please try these tips to maintain a healthy lifestyle:  Eat most of your calories during the day when you are active. Eliminate processed foods including packaged sweets (pies, cakes, cookies), reduce intake of potatoes, white bread, white pasta, and white rice. Look for whole grain options, oat flour or almond flour.  Each meal should contain half fruits/vegetables, one quarter protein, and one quarter carbs (no bigger than a computer mouse).  Cut down on sweet beverages. This includes juice, soda, and sweet tea. Also watch fruit intake, though this is a healthier sweet option, it still contains natural sugar! Limit to 3 servings daily.  Drink at least 1 glass of water with each meal and aim for at least 8 glasses per day  Exercise at least 150 minutes every week.

## 2022-09-17 NOTE — Progress Notes (Signed)
New Patient Office Visit  Subjective:  Patient ID: Leslie Ayers, female    DOB: 07-May-1991  Age: 32 y.o. MRN: [id number]  CC:  Chief Complaint  Patient presents with   Establish Care    Need new pcp Thyroid level low previously Fasting    Ear Issue    Can hear heart beat in ear for a while    Memory Loss    HPI Leslie Ayers presents for new pt.  Thyroid  R ear issue for 4-24mo-heartbeat in Ear, "air".  Has seen ENT and CT Gyn last mo and tsh low.  Here for f/u. No palp.  Some jittery, no dysphagia, no diarrhea/wt loss. No supps.  Mgma-thyroid. Memory loss-forgets freq.like why in room-4-5 mo.  Doing ADL's fine.  Not getting lost.  Some depression-single mom, ADHD twin boys. Some anxiety at times.  No SI.  No meds.    Past Medical History:  Diagnosis Date   Allergy 04/16/1991   Penicillin   Anal fissure    Anxiety    Depression    History of echocardiogram    Echo 11/17: EF 60-65, no RWMA   Hx of chlamydia infection    Vaginal Pap smear, abnormal     Past Surgical History:  Procedure Laterality Date   BILATERAL SALPINGECTOMY Bilateral    w/c/s   CESAREAN SECTION N/A 09/12/2016   Procedure: CESAREAN SECTION;  Surgeon: Waymon Amato, MD;  Location: [place];  Service: Obstetrics;  Laterality: N/A;   COSMETIC SURGERY  11/21/2020   360 lipo an [place] butt lift   CYST EXCISION Left 09/12/2016   Procedure: resection of left labial cyst;  Surgeon: Waymon Amato, MD;  Location: [place];  Service: Obstetrics;  Laterality: Left;   MOUTH SURGERY     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     Family History  Problem Relation Age of Onset   Arthritis Mother    Irritable bowel syndrome Mother    Diabetes Mother    Diabetes Father    Stroke Father    Diabetes Brother    Stroke Maternal Uncle    Miscarriages / Stillbirths Maternal Grandmother    Depression Maternal Grandmother    Arthritis Maternal Grandmother    Arrhythmia Maternal Grandmother    Stroke  Maternal Grandmother    Diabetes Maternal Grandfather    Colon cancer Neg Hx     Social History   Socioeconomic History   Marital status: Single    Spouse name: Not on file   Number of children: 3   Years of education: Not on file   Highest education level: Not on file  Occupational History   Occupation: CNA  Tobacco Use   Smoking status: Never   Smokeless tobacco: Never  Vaping Use   Vaping Use: Former  Substance and Sexual Activity   Alcohol use: Not Currently    Comment: LAST DRANK-  LAST MTH   Drug use: Never   Sexual activity: Not Currently    Birth control/protection: Abstinence, None  Other Topics Concern   Not on file  Social History Narrative   1 set twins, 1 dau.   Single    Native to [place]   Stay at home Mom   Social Determinants of Health   Financial Resource Strain: Not on file  Food Insecurity: Not on file  Transportation Needs: Not on file  Physical Activity: Not on file  Stress: Not on file  Social Connections: Not on file  Intimate Partner Violence: Not on file    ROS  ROS: Gen: no fever, chills  Skin: no rash, itching ENT: no ear pain, ear drainage, nasal congestion, rhinorrhea, sinus pressure, sore throat Eyes: no blurry vision, double vision Resp: no cough, wheeze,SOB CV: no CP, palpitations, LE edema,  GI: no heartburn, n/v/d/c, abd pain GU: no dysuria, urgency, frequency, hematuria, reg menses-pap last mo on 5th.  Abn-going for colpo in March MSK: no joint pain, myalgias, back pain Neuro: no dizziness,  weakness, vertigo.  Some morning HA.   Psych: HPI  Objective:   Today's Vitals: BP 112/62   Pulse 78   Temp 98.9 F (37.2 C) (Temporal)   Ht 5\' 6"  (1.676 m)   Wt 202 lb 4 oz (91.7 kg)   LMP 08/20/2022 (Exact Date)   SpO2 99%   Breastfeeding No   BMI 32.64 kg/m   Physical Exam  Gen: WDWN NAD HEENT: NCAT, conjunctiva not injected, sclera nonicteric TM WNL B, OP moist, no exudates  NECK:  supple,+thyromegaly, no  nodes, no carotid bruits CARDIAC: RRR, S1S2+, no murmur. DP 2+B LUNGS: CTAB. No wheezes ABDOMEN:  BS+, soft, NTND, No HSM, no masses EXT:  no edema MSK: no gross abnormalities.  NEURO: A&O x3.  CN II-XII intact.  PSYCH: normal mood. Good eye contact   Reviewed records.  Tsh 0.404 08/24/22, FT4 1.12  Ft3 2.7  Assessment & Plan:   Problem List Items Addressed This Visit   None Visit Diagnoses     Pulsatile tinnitus of right ear    -  Primary   Relevant Orders   US Carotid Duplex Bilateral   Thyromegaly       Relevant Orders   Comprehensive metabolic panel   Hemoglobin A1c   TSH   CBC with Differential/Platelet   T3, free   T4, free   Thyroid Peroxidase Antibodies (TPO) (REFL)   US THYROID   Stress reaction          Pulsatile tinnitus R ear.  Has had ENT w/u and unremarkable CT.  ?carotids.  Check carotid doppler.  Discussed may just be new normal.  Thyromegaly-?goiter, viral, other.  Check cmp,A1c,cbc,tsh,ft3,ft4,tpo, u/s thyroid   f/u 1 mo. Stress reaction-out of work force, caring for family.  Continue exercise.  Work on puzzles, etc to work mind.    Outpatient Encounter Medications as of 09/17/2022  Medication Sig   [DISCONTINUED] Cholecalciferol (VITAMIN D) 2000 units tablet Take 2 tablets (4,000 Units total) by mouth daily.   [DISCONTINUED] ferrous sulfate 325 (65 FE) MG tablet Take 325 mg by mouth 2 (two) times daily.   [DISCONTINUED] ibuprofen (ADVIL,MOTRIN) 600 MG tablet Take 1 tablet (600 mg total) by mouth every 6 (six) hours.   [DISCONTINUED] metroNIDAZOLE (FLAGYL) 500 MG tablet Take 1 tablet (500 mg total) by mouth 2 (two) times daily.   [DISCONTINUED] Prenatal Vit-Fe Fumarate-FA (PRENATAL MULTIVITAMIN) TABS tablet Take 1 tablet by mouth daily at 12 noon.   No facility-administered encounter medications on file as of 09/17/2022.    Follow-up: Return in about 4 weeks (around 10/15/2022) for 4-6 wks-thyroid.   Wellington Hampshire, MD

## 2022-09-18 LAB — THYROID PEROXIDASE ANTIBODIES (TPO) (REFL): Thyroperoxidase Ab SerPl-aCnc: <1 IU/mL (ref <9)

## 2022-09-20 NOTE — Progress Notes (Signed)
I read levels are currently in normal range.  These can fluctuate depending on the issue.  Something we need to keep an eye on.  Await thyroid ultrasound results

## 2022-10-08 ENCOUNTER — Ambulatory Visit
Admission: RE | Admit: 2022-10-08 | Discharge: 2022-10-08 | Disposition: A | Discharge status: Home / Self Care | Payer: Medicaid Other | Source: Ambulatory Visit | Attending: Family Medicine | Admitting: Family Medicine

## 2022-10-08 DIAGNOSIS — E010 Iodine-deficiency related diffuse (endemic) goiter: Secondary | ICD-10-CM

## 2022-10-08 DIAGNOSIS — H93A1 Pulsatile tinnitus, right ear: Secondary | ICD-10-CM

## 2022-10-11 NOTE — Progress Notes (Signed)
Leslie Ayers.  She was supposed to schedule follow-up visit in 1 month which should be soon.  I do not see that she scheduled this.  Please get scheduled

## 2022-10-12 ENCOUNTER — Other Ambulatory Visit: Payer: Self-pay

## 2022-10-13 ENCOUNTER — Other Ambulatory Visit: Payer: Self-pay | Admitting: *Deleted

## 2022-10-13 DIAGNOSIS — F419 Anxiety disorder, unspecified: Secondary | ICD-10-CM

## 2022-10-13 MED ORDER — ESCITALOPRAM OXALATE 10 MG PO TABS: 10.0000 mg | ORAL_TABLET | Freq: Every day | ORAL | 1 refills | Status: DC

## 2022-10-23 ENCOUNTER — Ambulatory Visit: Payer: Medicaid Other | Admitting: Family Medicine

## 2022-10-23 ENCOUNTER — Encounter: Payer: Self-pay | Admitting: Family Medicine

## 2022-10-23 VITALS — BP 110/82 | HR 65 | Temp 97.9°F | Ht 66.0 in | Wt 195.0 lb

## 2022-10-23 DIAGNOSIS — F419 Anxiety disorder, unspecified: Secondary | ICD-10-CM | POA: Diagnosis not present

## 2022-10-23 DIAGNOSIS — J301 Allergic rhinitis due to pollen: Secondary | ICD-10-CM

## 2022-10-23 DIAGNOSIS — F32A Depression, unspecified: Secondary | ICD-10-CM | POA: Diagnosis not present

## 2022-10-23 DIAGNOSIS — Z91018 Allergy to other foods: Secondary | ICD-10-CM

## 2022-10-23 DIAGNOSIS — H93A1 Pulsatile tinnitus, right ear: Secondary | ICD-10-CM | POA: Diagnosis not present

## 2022-10-23 MED ORDER — FLUTICASONE PROPIONATE 50 MCG/ACT NA SUSP: 2.0000 | Freq: Every day | NASAL | 6 refills | Status: DC

## 2022-10-23 MED ORDER — ESCITALOPRAM OXALATE 10 MG PO TABS: 10.0000 mg | ORAL_TABLET | Freq: Every day | ORAL | 1 refills | Status: DC

## 2022-10-23 MED ORDER — EPINEPHRINE 0.3 MG/0.3ML IJ SOAJ: 0.3000 mg | INTRAMUSCULAR | 1 refills | Status: DC | PRN

## 2022-10-23 NOTE — Patient Instructions (Signed)
It was very nice to see you today!  Referral to allergist   PLEASE NOTE:  If you had any lab tests please let us know if you have not heard back within a few days. You may see your results on MyChart before we have a chance to review them but we will give you a call once they are reviewed by [place]. If we ordered any referrals today, please let us know if you have not heard from their office within the next week.   Please try these tips to maintain a healthy lifestyle:  Eat most of your calories during the day when you are active. Eliminate processed foods including packaged sweets (pies, cakes, cookies), reduce intake of potatoes, white bread, white pasta, and white rice. Look for whole grain options, oat flour or almond flour.  Each meal should contain half fruits/vegetables, one quarter protein, and one quarter carbs (no bigger than a computer mouse).  Cut down on sweet beverages. This includes juice, soda, and sweet tea. Also watch fruit intake, though this is a healthier sweet option, it still contains natural sugar! Limit to 3 servings daily.  Drink at least 1 glass of water with each meal and aim for at least 8 glasses per day  Exercise at least 150 minutes every week.

## 2022-10-23 NOTE — Progress Notes (Unsigned)
Subjective:     Patient ID: Leslie Ayers, female    DOB: 01/25/1991, 32 y.o.   MRN: [id number]  Chief Complaint  Patient presents with   Follow-up    4 week follow-up  Still having pulsating in ear Discuss medication for depression/anxiety Would like Flonase for allergy season    HPI  Allergies-would like flonase.  Works well.  Depression/anx.  No SI.  On Lexapro 1 yr ago, stopped as was doing better.  Now need to go back on.  No SI-just stressed Pulsations in R ear-has had CT, seen ENT, carotids Nuts and other foods irrit throat and scalp and rash.  The other day, cherries did it.  Wants to see allergist.   Bro w/seafood allergy  Health Maintenance Due  Topic Date Due   DTaP/Tdap/Td (2 - Td or Tdap) 06/21/2018    Past Medical History:  Diagnosis Date   Allergy 1990-09-24   Penicillin   Anal fissure    Anxiety    Depression    History of echocardiogram    Echo 11/17: EF 60-65, no RWMA   Hx of chlamydia infection    Vaginal Pap smear, abnormal     Past Surgical History:  Procedure Laterality Date   BILATERAL SALPINGECTOMY Bilateral    w/c/s   CESAREAN SECTION N/A 09/12/2016   Procedure: CESAREAN SECTION;  Surgeon: Waymon Amato, MD;  Location: [place];  Service: Obstetrics;  Laterality: N/A;   COSMETIC SURGERY  11/21/2020   360 lipo an [place] butt lift   CYST EXCISION Left 09/12/2016   Procedure: resection of left labial cyst;  Surgeon: Waymon Amato, MD;  Location: [place];  Service: Obstetrics;  Laterality: Left;   MOUTH SURGERY     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 (Patient not taking: Reported on 10/23/2022) 30 tablet 1   No facility-administered medications prior to visit.    Allergies  Allergen Reactions   Penicillins Hives, Itching, Rash and Swelling    Has patient had a  Has  patient had a PCN reaction that required hospitalization No  Has patient had a PCN reaction occurring within the last 10 years: No    PCN reaction causing immediate rash, facial/tongue/throat swelling, SOB or lightheadedness with hypotension: No  Has patient had a PCN reaction causing severe rash involving mucus membranes or skin necrosis: No  If all of the above answers are "NO", then may proceed with Cephalosporin use.  Has patient had a  Has patient had a PCN reaction that required hospitalization No  Has patient had a PCN reaction occurring within the last 10 years: No    PCN reaction causing immediate rash, facial/tongue/throat swelling, SOB or lightheadedness with hypotension: No  Has patient had a PCN reaction causing severe rash involving mucus membranes or skin necrosis: No  If all of the above answers are "NO", then may proceed with Cephalosporin use.  Has patient had a Has patient had a PCN reaction that required hospitalization No Has patient had a PCN reaction occurring within the last 10 years: No   PCN reaction causing immediate rash, facial/tongue/throat swelling, SOB or lightheadedness with hypotension: No Has patient had a PCN reaction causing severe rash involving mucus membranes or skin necrosis: No If all of the above answers are "NO", then may proceed with Cephalosporin  use.    Has patient had a Has patient had a PCN reaction that required hospitalization No Has patient had a PCN reaction occurring within the last 10 years: No   PCN reaction causing immediate rash, facial/tongue/throat swelling, SOB or lightheadedness with hypotension: No Has patient had a PCN reaction causing severe rash involving mucus membranes or skin necrosis: No If all of the above answers are "NO", then may proceed with Cephalosporin use.  Has patient had a, Has patient had a PCN reaction that required hospitalization No, Has patient had a PCN reaction occurring within the last 10 years: No,   PCN  reaction causing immediate rash, facial/tongue/throat swelling, SOB or lightheadedness with hypotension: No, Has patient had a PCN reaction causing severe rash involving mucus membranes or skin necrosis: No, If all of the above answers are "NO", then may proceed with Cephalosporin use.   ROS neg/noncontributory except as noted HPI/below      Objective:     BP 110/82   Pulse 65   Temp 97.9 F (36.6 C) (Temporal)   Ht 5\' 6"  (1.676 m)   Wt 195 lb (88.5 kg)   LMP 10/19/2022 (Exact Date)   SpO2 99%   BMI 31.47 kg/m  Wt Readings from Last 3 Encounters:  10/23/22 195 lb (88.5 kg)  09/17/22 202 lb 4 oz (91.7 kg)  12/03/20 193 lb (87.5 kg)    Physical Exam   Gen: WDWN NAD HEENT: NCAT, conjunctiva not injected, sclera nonicteric TM WNL B, OP moist, no exudates  NECK:  supple, no thyromegaly, no nodes, no carotid bruits CARDIAC: RRR, S1S2+, no murmur. DP 2+B LUNGS: CTAB. No wheezes ABDOMEN:  BS+, soft, NTND, No HSM, no masses EXT:  no edema MSK: no gross abnormalities.  NEURO: A&O x3.  CN II-XII intact.  PSYCH: normal mood. Good eye contact     Assessment & Plan:   Problem List Items Addressed This Visit       Nervous and Auditory   Pulsatile tinnitus of right ear     Other   Depressive disorder   Relevant Medications   escitalopram (LEXAPRO) 10 MG tablet   Other Visit Diagnoses     Non-seasonal allergic rhinitis due to pollen    -  Primary   Food allergy       Relevant Orders   Ambulatory referral to Allergy   Anxiety       Relevant Medications   escitalopram (LEXAPRO) 10 MG tablet     1.  Allergic rhinitis-chronic.  Flaring this time of the year.  Flonase works well.  Renewed 2 sprays each nostril daily 2.  Food allergies-prescription written for EpiPen-discussed when and how to take it.  Refer to allergist 3.  Pulsatile tinnitus right ear-chronic.  Workup has been negative.  Discussed no good treatment.  She is okay with this 4.  Anxiety/depression-did well  on Lexapro 10 mg daily in the past.  Will restart.  If she is having problems, let us know,  Follow-up 6 months  Meds ordered this encounter  Medications   escitalopram (LEXAPRO) 10 MG tablet    Sig: Take 1 tablet (10 mg total) by mouth daily.    Dispense:  90 tablet    Refill:  1   fluticasone (FLONASE) 50 MCG/ACT nasal spray    Sig: Place 2 sprays into both nostrils daily.    Dispense:  16 g    Refill:  6   EPINEPHrine 0.3 mg/0.3 mL IJ SOAJ injection  Sig: Inject 0.3 mg into the muscle as needed for anaphylaxis.    Dispense:  1 each    Refill:  1    Wellington Hampshire, MD

## 2022-10-24 DIAGNOSIS — H93A1 Pulsatile tinnitus, right ear: Secondary | ICD-10-CM | POA: Insufficient documentation

## 2022-10-26 ENCOUNTER — Ambulatory Visit (INDEPENDENT_AMBULATORY_CARE_PROVIDER_SITE_OTHER): Payer: Medicaid Other | Admitting: Primary Care

## 2023-01-01 ENCOUNTER — Other Ambulatory Visit: Payer: Self-pay

## 2023-01-01 ENCOUNTER — Ambulatory Visit (INDEPENDENT_AMBULATORY_CARE_PROVIDER_SITE_OTHER): Payer: Medicaid Other | Admitting: Internal Medicine

## 2023-01-01 ENCOUNTER — Encounter: Payer: Self-pay | Admitting: Internal Medicine

## 2023-01-01 VITALS — BP 120/80 | HR 87 | Temp 98.3°F | Ht 65.75 in | Wt 195.0 lb

## 2023-01-01 DIAGNOSIS — J3081 Allergic rhinitis due to animal (cat) (dog) hair and dander: Secondary | ICD-10-CM

## 2023-01-01 DIAGNOSIS — J301 Allergic rhinitis due to pollen: Secondary | ICD-10-CM

## 2023-01-01 DIAGNOSIS — L272 Dermatitis due to ingested food: Secondary | ICD-10-CM | POA: Diagnosis not present

## 2023-01-01 DIAGNOSIS — T781XXA Other adverse food reactions, not elsewhere classified, initial encounter: Secondary | ICD-10-CM

## 2023-01-01 DIAGNOSIS — J3089 Other allergic rhinitis: Secondary | ICD-10-CM | POA: Diagnosis not present

## 2023-01-01 MED ORDER — OLOPATADINE HCL 0.2 % OP SOLN: 1.0000 [drp] | Freq: Every day | OPHTHALMIC | 5 refills | Status: DC | PRN

## 2023-01-01 MED ORDER — AZELASTINE HCL 0.1 % NA SOLN: 1.0000 | Freq: Two times a day (BID) | NASAL | 5 refills | Status: DC | PRN

## 2023-01-01 MED ORDER — FLUTICASONE PROPIONATE 50 MCG/ACT NA SUSP: 2.0000 | Freq: Every day | NASAL | 5 refills | Status: DC

## 2023-01-01 MED ORDER — CETIRIZINE HCL 10 MG PO TABS: 10.0000 mg | ORAL_TABLET | Freq: Every day | ORAL | 5 refills | Status: DC

## 2023-01-01 NOTE — Patient Instructions (Addendum)
Allergic Rhinitis: - Positive skin test 12/2022: trees, grasses, weeds, mold, cat, dog  - Avoidance measures discussed. - Use nasal saline rinses before nose sprays such as with Neilmed Sinus Rinse.  Use distilled water.   - Use Flonase 2 sprays each nostril daily. Aim upward and outward. - Use Azelastine 1-2 sprays each nostril twice daily as needed. Aim upward and outward. - Use Zyrtec 10 mg daily.  - For eyes, use Olopatadine or Ketotifen 1 eye drop daily as needed for itchy, watery eyes.  Available over the counter, if not covered by insurance.  - Consider allergy shots as long term control of your symptoms by teaching your immune system to be more tolerant of your allergy triggers  Oral Allergy Syndrome- Fruits: - These symptoms are typically not life-threatening and are because of a cross reaction between a pollen you are allergic to, and to a protein in specific foods (such as fresh fruits) - If you can eat these things and tolerate the symptoms, it is fine to continue to do so.  If not, you may avoid these fresh fruits. - Heating these foods, buying them canned, and peeling these foods should allow them to be consumed without symptoms or with less symptoms.  Food Intolerances- Gluten and Nuts - Facial rash with gluten, feeling of dryness with ingestion of nuts  - These are not IgE mediated allergies.  Likely form of intolerances.  If you can eat them in small amounts, continue to do so.     ALLERGEN AVOIDANCE MEASURES  Molds - Indoor avoidance Use air conditioning to reduce indoor humidity.  Do not use a humidifier. Keep indoor humidity at 30 - 40%.  Use a dehumidifier if needed. In the bathroom use an exhaust fan or open a window after showering.  Wipe down damp surfaces after showering.  Clean bathrooms with a mold-killing solution (diluted bleach, or products like Tilex, etc) at least once a month. In the kitchen use an exhaust fan to remove steam from cooking.  Throw away spoiled  foods immediately, and empty garbage daily.  Empty water pans below self-defrosting refrigerators frequently. Vent the clothes dryer to the outside. Limit indoor houseplants; mold grows in the dirt.  No houseplants in the bedroom. Remove carpet from the bedroom. Encase the mattress and box springs with a zippered encasing.  Molds - Outdoor avoidance Avoid being outside when the grass is being mowed, or the ground is tilled. Avoid playing in leaves, pine straw, hay, etc.  Dead plant materials contain mold. Avoid going into barns or grain storage areas. Remove leaves, clippings and compost from around the home. Pollen Avoidance Pollen levels are highest during the mid-day and afternoon.  Consider this when planning outdoor activities. Avoid being outside when the grass is being mowed, or wear a mask if the pollen-allergic person must be the one to mow the grass. Keep the windows closed to keep pollen outside of the home. Use an air conditioner to filter the air. Take a shower, wash hair, and change clothing after working or playing outdoors during pollen season. Pet Dander Keep the pet out of your bedroom and restrict it to only a few rooms. Be advised that keeping the pet in only one room will not limit the allergens to that room. Don't pet, hug or kiss the pet; if you do, wash your hands with soap and water. High-efficiency particulate air (HEPA) cleaners run continuously in a bedroom or living room can reduce allergen levels over time. Regular  use of a high-efficiency vacuum cleaner or a central vacuum can reduce allergen levels. Giving your pet a bath at least once a week can reduce airborne allergen.

## 2023-01-01 NOTE — Progress Notes (Signed)
NEW PATIENT  Date of Service/Encounter:  01/01/23  Consult requested by: Jeani Sow, MD   Subjective:   Leslie Ayers (DOB: 1991/02/26) is a 32 y.o. female who presents to the clinic on 01/01/2023 with a chief complaint of Allergy Testing (Pt states that any fresh fruit is causing allergic reaction that burns her mouth also after eating meat cheeks turn red and develop bumps on face.) and Establish Care .    History obtained from: chart review and patient.   Rhinitis:  Started in childhood.  Symptoms include: nasal congestion, rhinorrhea, post nasal drainage, and sneezing  Occurs year-round Potential triggers: not sure, thinks pollen  Treatments tried:  Zyrtec PRN; last use was 1 week ago   Previous allergy testing: no History of reflux/heartburn: none History of sinus surgery: no Nonallergic triggers: none    Concern for Food Allergy:  Foods of concern: around age 70, started having reactions with fresh fruits causing lips tingling and burning and throat itching.  Has happened with cherry, apples, peaches.  She usually does okay with canned fruits though.   Gluten causes red bumpy rashes on face, no other symptoms.  Nuts cause her to feel dry, has to drink lots of water   Never has undergone allergy testing. Was prescribed Epipen for the fruit reactions but never received it as insurance would not cover it.   Past Medical History: Past Medical History:  Diagnosis Date   Allergy 1991/04/27   Penicillin   Anal fissure    Anxiety    Depression    History of echocardiogram    Echo 11/17: EF 60-65, no RWMA   Hx of chlamydia infection    Vaginal Pap smear, abnormal     Past Surgical History: Past Surgical History:  Procedure Laterality Date   BILATERAL SALPINGECTOMY Bilateral    w/c/s   CESAREAN SECTION N/A 09/12/2016   Procedure: CESAREAN SECTION;  Surgeon: Hoover Browns, MD;  Location: [facility];  Service: Obstetrics;  Laterality: N/A;   COSMETIC  SURGERY  11/21/2020   360 lipo an [place] butt lift   CYST EXCISION Left 09/12/2016   Procedure: resection of left labial cyst;  Surgeon: Hoover Browns, MD;  Location: [facility];  Service: Obstetrics;  Laterality: Left;   MOUTH SURGERY     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     Family History: Family History  Problem Relation Age of Onset   Allergic rhinitis Mother    Arthritis Mother    Irritable bowel syndrome Mother    Diabetes Mother    Diabetes Father    Stroke Father    Diabetes Brother    Stroke Maternal Uncle    Miscarriages / Stillbirths Maternal Grandmother    Depression Maternal Grandmother    Arthritis Maternal Grandmother    Arrhythmia Maternal Grandmother    Stroke Maternal Grandmother    Diabetes Maternal Grandfather    Colon cancer Neg Hx     Social History:  Lives in a 32 year house Flooring in bedroom: tile Pets: dog Tobacco use/exposure: none Job: n/a   Medication List:  Allergies as of 01/01/2023       Reactions   Penicillins Hives, Itching, Rash, Swelling   Has patient had a Has patient had a PCN reaction that required hospitalization No Has patient had a PCN reaction occurring within the last 10 years: No   PCN reaction causing immediate rash, facial/tongue/throat swelling, SOB or lightheadedness with hypotension: No Has patient had a  PCN reaction causing severe rash involving mucus membranes or skin necrosis: No If all of the above answers are "NO", then may proceed with Cephalosporin use. Has patient had a  Has patient had a PCN reaction that required hospitalization No  Has patient had a PCN reaction occurring within the last 10 years: No    PCN reaction causing immediate rash, facial/tongue/throat swelling, SOB or lightheadedness with hypotension: No  Has patient had a PCN reaction causing severe rash involving mucus membranes or skin necrosis: No  If all of the above answers are "NO", then may proceed with Cephalosporin use. Has patient  had a Has patient had a PCN reaction that required hospitalization No Has patient had a PCN reaction occurring within the last 10 years: No   PCN reaction causing immediate rash, facial/tongue/throat swelling, SOB or lightheadedness with hypotension: No Has patient had a PCN reaction causing severe rash involving mucus membranes or skin necrosis: No If all of the above answers are "NO", then may proceed with Cephalosporin use.    Has patient had a Has patient had a PCN reaction that required hospitalization No Has patient had a PCN reaction occurring within the last 10 years: No   PCN reaction causing immediate rash, facial/tongue/throat swelling, SOB or lightheadedness with hypotension: No Has patient had a PCN reaction causing severe rash involving mucus membranes or skin necrosis: No If all of the above answers are "NO", then may proceed with Cephalosporin use. Has patient had a, Has patient had a PCN reaction that required hospitalization No, Has patient had a PCN reaction occurring within the last 10 years: No,   PCN reaction causing immediate rash, facial/tongue/throat swelling, SOB or lightheadedness with hypotension: No, Has patient had a PCN reaction causing severe rash involving mucus membranes or skin necrosis: No, If all of the above answers are "NO", then may proceed with Cephalosporin use.        Medication List        Accurate as of Jan 01, 2023 12:10 PM. If you have any questions, ask your nurse or doctor.          EPINEPHrine 0.3 mg/0.3 mL Soaj injection Commonly known as: EPI-PEN Inject 0.3 mg into the muscle as needed for anaphylaxis.   escitalopram 10 MG tablet Commonly known as: Lexapro Take 1 tablet (10 mg total) by mouth daily.   fluticasone 50 MCG/ACT nasal spray Commonly known as: FLONASE Place 2 sprays into both nostrils daily.         REVIEW OF SYSTEMS: Pertinent positives and negatives discussed in HPI.   Objective:   Physical Exam: BP 120/80    Pulse 87   Temp 98.3 F (36.8 C)   Ht 5' 5.75" (1.67 m)   Wt 195 lb (88.5 kg)   SpO2 99%   BMI 31.72 kg/m  Body mass index is 31.72 kg/m. GEN: alert, well developed HEENT: clear conjunctiva, TM grey and translucent, nose with + inferior turbinate hypertrophy, boggy nasal mucosa, slight clear rhinorrhea, no cobblestoning HEART: regular rate and rhythm, no murmur LUNGS: clear to auscultation bilaterally, no coughing, unlabored respiration ABDOMEN: soft, non distended  SKIN: no rashes or lesions  Reviewed:  10/23/2022: saw Dr. Ruthine Dose for allergic rhinoconjunctivitis and food allergies.  Referred to Allergy for further evaluation. Discussed use of Flonase and keeping Epipen.   08/14/2022: saw obgyn for annual exam.  Having menopause like symptoms and discussed wanting IUD removal. Did undergo removal without any issues.    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 Discussed CT temporal bones that was negative.   Skin Testing:  Skin prick testing was placed, which includes aeroallergens/foods, histamine control, and saline control.  Verbal consent was obtained prior to placing test.  Patient tolerated procedure well.  Allergy testing results were read and interpreted by myself, documented by clinical staff. Adequate positive and negative control.  Results discussed with patient/family.  Airborne Adult Perc - 01/01/23 1005     Time Antigen Placed 1610    Allergen Manufacturer Waynette Buttery    Location Back    Number of Test 55    1. Control-Buffer 50% Glycerol Negative    2. Control-Histamine 3+    3. Bahia Negative    4. [place] 3+    5. Johnson 3+    6. Kentucky Blue Negative    7. Meadow Fescue 3+    8. Perennial Rye 3+    9. Timothy 3+    10. Ragweed Mix Negative    11. Cocklebur Negative    12. Plantain,  English Negative    13. Baccharis Negative    14. Dog Fennel Negative    15. Russian Thistle Negative    16. Lamb's Quarters Negative    17. Sheep Sorrell Negative     18. Rough Pigweed Negative    19. Marsh Elder, Rough Negative    20. Mugwort, Common 3+    21. Box, Elder Negative    22. Cedar, red Negative    23. Sweet Gum 3+    24. Pecan Pollen Negative    25. Pine Mix Negative    26. Walnut, Black Pollen 3+    27. Red Mulberry Negative    28. Ash Mix 3+    29. Birch Mix 3+    30. Beech American 3+    31. Cottonwood, Guinea[place]Bissau Negative    32. Hickory, White 3+    33. Maple Mix 3+    34. Oak, [place] Mix 3+    35. Sycamore Eastern Negative    36. Alternaria Alternata 3+    37. Cladosporium Herbarum 2+    38. Aspergillus Mix Negative    39. Penicillium Mix Negative    40. Bipolaris Sorokiniana (Helminthosporium) 3+    41. Drechslera Spicifera (Curvularia) Negative    42. Mucor Plumbeus Negative    43. Fusarium Moniliforme Negative    44. Aureobasidium Pullulans (pullulara) Negative    45. Rhizopus Oryzae Negative    46. Botrytis Cinera Negative    47. Epicoccum Nigrum 3+    48. Phoma Betae 3+    49. Dust Mite Mix Negative    50. Cat Hair 10,000 BAU/ml 3+    51.  Dog Epithelia Negative    52. Mixed Feathers Negative    53. Horse Epithelia Negative    54. Cockroach, German Negative    55. Tobacco Leaf Negative             Intradermal - 01/01/23 1049     Time Antigen Placed 1049    Allergen Manufacturer Greer    Location Arm    Number of Test 9    Control Negative    Bahia 3+    Ragweed Mix 3+    Weed Mix 3+    Mold 2 3+    Mold 4 3+    Mite Mix Negative    Dog 2+    Cockroach Negative             Food Adult Perc -  01/01/23 1000     Time Antigen Placed 1610    Allergen Manufacturer Waynette Buttery    Location Back    Number of allergen test 4    3. Wheat Negative    58. Apple Negative    59. Peach Negative    62. Cherry Negative               Assessment:   1. Pollen-food allergy, initial encounter   2. Nasal turbinate hypertrophy   3. Allergic rhinitis caused by mold   4. Allergic rhinitis due to animal  hair or dander   5. Non-seasonal allergic rhinitis due to pollen     Plan/Recommendations:  Allergic Rhinitis: - Due to turbinate hypertrophy, cross reactivity with foods and unresponsive to OTC meds, performed skin testing to identify aeroallergen triggers.  - Positive skin test 12/2022: trees, grasses, weeds, mold, cat, dog  - Avoidance measures discussed. - Use nasal saline rinses before nose sprays such as with Neilmed Sinus Rinse.  Use distilled water.   - Use Flonase 2 sprays each nostril daily. Aim upward and outward. - Use Azelastine 1-2 sprays each nostril twice daily as needed. Aim upward and outward. - Use Zyrtec 10 mg daily.  - For eyes, use Olopatadine or Ketotifen 1 eye drop daily as needed for itchy, watery eyes.  Available over the counter, if not covered by insurance.  - Consider allergy shots as long term control of your symptoms by teaching your immune system to be more tolerant of your allergy triggers  Oral Allergy Syndrome- Fruits: - SPT 12/2022 negative to apples, peaches, cherry.  - These symptoms are typically not life-threatening and are because of a cross reaction between a pollen you are allergic to, and to a protein in specific foods (such as fresh fruits); therefore, Epipen is generally not needed. - If you can eat these things and tolerate the symptoms, it is fine to continue to do so.  If not, you may avoid these fresh fruits. - Heating these foods, buying them canned, and peeling these foods should allow them to be consumed without symptoms or with less symptoms.  Food Intolerances- Gluten and Nuts - SPT 12/2022 negative to wheat.  - Facial rash with gluten, feeling of dryness with ingestion of nuts  - These are not IgE mediated allergies.  Likely form of intolerances.  If you can eat them in small amounts, continue to do so.  Return in about 6 weeks (around 02/12/2023).  Alesia Morin, MD [facility]

## 2023-02-23 ENCOUNTER — Ambulatory Visit (INDEPENDENT_AMBULATORY_CARE_PROVIDER_SITE_OTHER): Payer: Medicaid Other | Admitting: Internal Medicine

## 2023-02-23 ENCOUNTER — Other Ambulatory Visit: Payer: Self-pay

## 2023-02-23 ENCOUNTER — Encounter: Payer: Self-pay | Admitting: Internal Medicine

## 2023-02-23 VITALS — BP 118/80 | HR 96 | Temp 98.4°F | Ht 66.14 in | Wt 203.6 lb

## 2023-02-23 DIAGNOSIS — T781XXD Other adverse food reactions, not elsewhere classified, subsequent encounter: Secondary | ICD-10-CM | POA: Diagnosis not present

## 2023-02-23 DIAGNOSIS — J3089 Other allergic rhinitis: Secondary | ICD-10-CM | POA: Diagnosis not present

## 2023-02-23 DIAGNOSIS — J301 Allergic rhinitis due to pollen: Secondary | ICD-10-CM | POA: Diagnosis not present

## 2023-02-23 DIAGNOSIS — J3081 Allergic rhinitis due to animal (cat) (dog) hair and dander: Secondary | ICD-10-CM | POA: Diagnosis not present

## 2023-02-23 MED ORDER — CETIRIZINE HCL 10 MG PO TABS: 10.0000 mg | ORAL_TABLET | Freq: Every day | ORAL | 5 refills | Status: DC

## 2023-02-23 MED ORDER — AZELASTINE HCL 0.1 % NA SOLN: 1.0000 | Freq: Two times a day (BID) | NASAL | 5 refills | Status: DC | PRN

## 2023-02-23 MED ORDER — OLOPATADINE HCL 0.2 % OP SOLN: 1.0000 [drp] | Freq: Every day | OPHTHALMIC | 5 refills | Status: DC | PRN

## 2023-02-23 MED ORDER — FLUTICASONE PROPIONATE 50 MCG/ACT NA SUSP: 2.0000 | Freq: Every day | NASAL | 5 refills | Status: DC

## 2023-02-23 NOTE — Patient Instructions (Signed)
Allergic Rhinitis: - Positive skin test 12/2022: trees, grasses, weeds, mold, cat, dog  - Avoidance measures discussed. - Use nasal saline rinses before nose sprays such as with Neilmed Sinus Rinse.  Use distilled water.   - Use Flonase 2 sprays each nostril daily. Aim upward and outward. - Use Azelastine 1-2 sprays each nostril twice daily as needed. Aim upward and outward. - Use Zyrtec 10 mg daily.  - For eyes, use Olopatadine or Ketotifen 1 eye drop daily as needed for itchy, watery eyes.  Available over the counter, if not covered by insurance.  - Consider allergy shots as long term control of your symptoms by teaching your immune system to be more tolerant of your allergy triggers  Oral Allergy Syndrome- Fruits: - These symptoms are typically not life-threatening and are because of a cross reaction between a pollen you are allergic to, and to a protein in specific foods (such as fresh fruits) - If you can eat these things and tolerate the symptoms, it is fine to continue to do so.  If not, you may avoid these fresh fruits. - Heating these foods, buying them canned, and peeling these foods should allow them to be consumed without symptoms or with less symptoms.

## 2023-02-23 NOTE — Progress Notes (Signed)
   FOLLOW UP Date of Service/Encounter:  02/23/23   Subjective:  Leslie Ayers (DOB: May 01, 1991) is a 32 y.o. female [facility] for follow up for PFAS and allergic rhinoconjunctivitis.   History obtained from: chart review and patient. Last visit was with me on 01/01/2023 and was SPT positive to multiple allergens so we discussed combination of Flonase, Azelastine, Zyrtec, Olopatadine PRN.     Reports doing much better since last visit.  Her allergy symptoms are more manageable with medications.  She used to itch a lot especially outdoors but that has resolved with Zyrtec.  In terms of sneezing, has noticed significant reduction and is able to spend more time outdoors. Taking Flonase, Azelastine, Zyrtec daily and rarely Olopatadine.  Still avoiding certain fresh fruits but tolerates them cooked. No systemic reactions with fruits.   Past Medical History: Past Medical History:  Diagnosis Date   Allergy [birth year]-Mar-1991   Penicillin   Anal fissure    Anxiety    Depression    History of echocardiogram    Echo 11/17: EF 60-65, no RWMA   Hx of chlamydia infection    Vaginal Pap smear, abnormal     Objective:  BP 118/80   Pulse 96   Temp 98.4 F (36.9 C) (Oral)   Ht 5' 6.14" (1.68 m)   Wt 203 lb 9.6 oz (92.4 kg)   SpO2 98%   BMI 32.72 kg/m  Body mass index is 32.72 kg/m. Physical Exam: GEN: alert, well developed HEENT: clear conjunctiva, TM grey and translucent, nose with moderate inferior turbinate hypertrophy, pink nasal mucosa, clear rhinorrhea, no cobblestoning HEART: regular rate and rhythm, no murmur LUNGS: clear to auscultation bilaterally, no coughing, unlabored respiration SKIN: no rashes or lesions  Assessment:   1. Allergic rhinitis caused by mold   2. Allergic rhinitis due to animal hair or dander   3. Non-seasonal allergic rhinitis due to pollen   4. Pollen-food allergy, subsequent encounter      Plan/Recommendations:  Allergic Rhinitis: - Controlled with medical management  - Positive skin test 12/2022: trees, grasses, weeds, mold, cat, dog  - Avoidance measures discussed. - Use nasal saline rinses before nose sprays such as with Neilmed Sinus Rinse.  Use distilled water.   - Use Flonase 2 sprays each nostril daily. Aim upward and outward. - Use Azelastine 1-2 sprays each nostril twice daily as needed. Aim upward and outward. - Use Zyrtec 10 mg daily.  - For eyes, use Olopatadine or Ketotifen 1 eye drop daily as needed for itchy, watery eyes.  Available over the counter, if not covered by insurance.  - Consider allergy shots as long term control of your symptoms by teaching your immune system to be more tolerant of your allergy triggers  Oral Allergy Syndrome- Fruits: - These symptoms are typically not life-threatening and are because of a cross reaction between a pollen you are allergic to, and to a protein in specific foods (such as fresh fruits) - If you can eat these things and tolerate the symptoms, it is fine to continue to do so.  If not, you may avoid these fresh fruits. - Heating these foods, buying them canned, and peeling these foods should allow them to be consumed without symptoms or with less symptoms.      Return in about 6 months (around 08/26/2023).  Alesia Morin, MD [facility]

## 2023-03-25 ENCOUNTER — Encounter (INDEPENDENT_AMBULATORY_CARE_PROVIDER_SITE_OTHER): Payer: Self-pay

## 2023-04-21 ENCOUNTER — Ambulatory Visit: Payer: Medicaid Other | Admitting: Family Medicine

## 2023-04-21 ENCOUNTER — Encounter: Payer: Self-pay | Admitting: Family Medicine

## 2023-04-21 VITALS — BP 115/78 | HR 75 | Temp 97.6°F | Resp 18 | Ht 66.0 in | Wt 208.4 lb

## 2023-04-21 DIAGNOSIS — H93A1 Pulsatile tinnitus, right ear: Secondary | ICD-10-CM

## 2023-04-21 DIAGNOSIS — F32A Depression, unspecified: Secondary | ICD-10-CM

## 2023-04-21 DIAGNOSIS — F419 Anxiety disorder, unspecified: Secondary | ICD-10-CM | POA: Diagnosis not present

## 2023-04-21 MED ORDER — ESCITALOPRAM OXALATE 10 MG PO TABS: 10.0000 mg | ORAL_TABLET | Freq: Every day | ORAL | 1 refills | Status: AC

## 2023-04-21 NOTE — Progress Notes (Signed)
Subjective:     Patient ID: Leslie Ayers, female    DOB: [birth year], 32 y.o.   MRN: [id number]  Chief Complaint  Patient presents with   Follow-up    6 month follow-up on moods Still having ear issue, would like referral to Chiropractor     HPI Depression/Anxiety - States she is responding well to the Lexapro 10 mg daily. No SI  Allergies - Reports she did visit the allergist. Is on cetrizine 10 mg and Flonase nasal spray daily.  Ear - Complains of still feeling pressure and pulsing in her R ear. Has seen ENT, audiology, had CT, carotid doppler. Would like to visit Chiropractor to see if this could be attributed to her neck. She states the only thing that provides relief is headphones to block the sound.     Health Maintenance Due  Topic Date Due   DTaP/Tdap/Td (2 - Td or Tdap) 06/21/2018    Past Medical History:  Diagnosis Date   Allergy 05-03-1991   Penicillin   Anal fissure    Anxiety    Depression    History of echocardiogram    Echo 11/17: EF 60-65, no RWMA   Hx of chlamydia infection    Vaginal Pap smear, abnormal     Past Surgical History:  Procedure Laterality Date   BILATERAL SALPINGECTOMY Bilateral    w/c/s   CESAREAN SECTION N/A 09/12/2016   Procedure: CESAREAN SECTION;  Surgeon: Hoover Browns, MD;  Location: [facility];  Service: Obstetrics;  Laterality: N/A;   COSMETIC SURGERY  11/21/2020   360 lipo an [place] butt lift   CYST EXCISION Left 09/12/2016   Procedure: resection of left labial cyst;  Surgeon: Hoover Browns, MD;  Location: [facility];  Service: Obstetrics;  Laterality: Left;   MOUTH SURGERY     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 Use in each nostril as directed, Disp: 30 mL, Rfl: 5   cetirizine (ZYRTEC ALLERGY) 10 MG tablet, Take 1 tablet (10 mg total) by mouth daily., Disp: 30 tablet,  Rfl: 5   EPINEPHrine 0.3 mg/0.3 mL IJ SOAJ injection, Inject 0.3 mg into the muscle as needed for anaphylaxis., Disp: 1 each, Rfl: 1   fluticasone (FLONASE) 50 MCG/ACT nasal spray, Place 2 sprays into both nostrils daily., Disp: 16 g, Rfl: 5   Olopatadine HCl 0.2 % SOLN, Apply 1 drop to eye daily as needed (itchy watery eyes)., Disp: 2.5 mL, Rfl: 5   escitalopram (LEXAPRO) 10 MG tablet, Take 1 tablet (10 mg total) by mouth daily., Disp: 90 tablet, Rfl: 1  Allergies  Allergen Reactions   Penicillins Hives, Itching, Rash and Swelling    Has patient had a  Has patient had a PCN reaction that required hospitalization No  Has patient had a PCN reaction occurring within the last 10 years: No    PCN reaction causing immediate rash, facial/tongue/throat swelling, SOB or lightheadedness with hypotension: No  Has patient had a PCN reaction causing severe rash involving mucus membranes or skin necrosis: No  If all of the above answers are "NO", then may proceed with Cephalosporin use.  Has patient had a  Has patient had a PCN reaction that required hospitalization No  Has patient had a PCN reaction occurring within the last 10 years: No    PCN reaction  causing immediate rash, facial/tongue/throat swelling, SOB or lightheadedness with hypotension: No  Has patient had a PCN reaction causing severe rash involving mucus membranes or skin necrosis: No  If all of the above answers are "NO", then may proceed with Cephalosporin use.  Has patient had a Has patient had a PCN reaction that required hospitalization No Has patient had a PCN reaction occurring within the last 10 years: No   PCN reaction causing immediate rash, facial/tongue/throat swelling, SOB or lightheadedness with hypotension: No Has patient had a PCN reaction causing severe rash involving mucus membranes or skin necrosis: No If all of the above answers are "NO", then may proceed with Cephalosporin use.    Has patient had a Has patient had  a PCN reaction that required hospitalization No Has patient had a PCN reaction occurring within the last 10 years: No   PCN reaction causing immediate rash, facial/tongue/throat swelling, SOB or lightheadedness with hypotension: No Has patient had a PCN reaction causing severe rash involving mucus membranes or skin necrosis: No If all of the above answers are "NO", then may proceed with Cephalosporin use.  Has patient had a, Has patient had a PCN reaction that required hospitalization No, Has patient had a PCN reaction occurring within the last 10 years: No,   PCN reaction causing immediate rash, facial/tongue/throat swelling, SOB or lightheadedness with hypotension: No, Has patient had a PCN reaction causing severe rash involving mucus membranes or skin necrosis: No, If all of the above answers are "NO", then may proceed with Cephalosporin use.   ROS neg/noncontributory except as noted HPI/below      Objective:     BP 115/78   Pulse 75   Temp 97.6 F (36.4 C) (Temporal)   Resp 18   Ht 5\' 6"  (1.676 m)   Wt 208 lb 6 oz (94.5 kg)   LMP 04/20/2023 (Exact Date)   SpO2 100%   BMI 33.63 kg/m  Wt Readings from Last 3 Encounters:  04/21/23 208 lb 6 oz (94.5 kg)  02/23/23 203 lb 9.6 oz (92.4 kg)  01/01/23 195 lb (88.5 kg)    Physical Exam   Gen: WDWN NAD HEENT: NCAT, conjunctiva not injected, sclera nonicteric NECK:  supple, no thyromegaly, no nodes, no carotid bruits CARDIAC: RRR, S1S2+, no murmur. DP 2+B LUNGS: CTAB. No wheezes ABDOMEN:  BS+, soft, NTND, No HSM, no masses EXT:  no edema MSK: no gross abnormalities.  NEURO: A&O x3.  CN II-XII intact.  PSYCH: normal mood. Good eye contact     Assessment & Plan:  Depressive disorder Assessment & Plan: Chronic.  Well-controlled on Lexapro 10 mg daily.  Continue   Anxiety -     Escitalopram Oxalate; Take 1 tablet (10 mg total) by mouth daily.  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  Had workup.  Had she would like to pursue is seeing a [facility].  Advised to call insurance and get scheduled and if needs a referral, let us know     Return in about 6 months (around 10/19/2023) for mood and annual.  Melina Fiddler N Rice,acting as a scribe for Angelena Sole, MD.,have documented all relevant documentation on the behalf of Angelena Sole, MD,as directed by  Angelena Sole, MD while in the presence of Angelena Sole, MD.  I, Angelena Sole, MD, have reviewed all documentation for this visit. The documentation on 04/21/23 for the exam, diagnosis, procedures, and orders are  all accurate and complete.    Angelena Sole, MD

## 2023-04-21 NOTE — Patient Instructions (Signed)

## 2023-04-21 NOTE — Assessment & Plan Note (Signed)
Chronic.  Had workup.  Had she would like to pursue is seeing a [facility].  Advised to call insurance and get scheduled and if needs a referral, let us know

## 2023-04-21 NOTE — Assessment & Plan Note (Signed)
Chronic.  Well-controlled on Lexapro 10 mg daily.  Continue

## 2023-08-24 ENCOUNTER — Ambulatory Visit: Payer: Medicaid Other | Admitting: Internal Medicine

## 2023-09-02 ENCOUNTER — Encounter: Payer: Self-pay | Admitting: Internal Medicine

## 2023-09-02 ENCOUNTER — Other Ambulatory Visit: Payer: Self-pay

## 2023-09-02 ENCOUNTER — Ambulatory Visit: Payer: Medicaid Other | Admitting: Internal Medicine

## 2023-09-02 VITALS — BP 108/82 | HR 78 | Temp 99.5°F | Ht 66.0 in | Wt 204.3 lb

## 2023-09-02 DIAGNOSIS — J302 Other seasonal allergic rhinitis: Secondary | ICD-10-CM | POA: Diagnosis not present

## 2023-09-02 DIAGNOSIS — T781XXD Other adverse food reactions, not elsewhere classified, subsequent encounter: Secondary | ICD-10-CM | POA: Diagnosis not present

## 2023-09-02 DIAGNOSIS — J3089 Other allergic rhinitis: Secondary | ICD-10-CM

## 2023-09-02 MED ORDER — FLUTICASONE PROPIONATE 50 MCG/ACT NA SUSP: 2.0000 | Freq: Every day | NASAL | 5 refills | Status: DC

## 2023-09-02 MED ORDER — OLOPATADINE HCL 0.2 % OP SOLN: 1.0000 [drp] | Freq: Every day | OPHTHALMIC | 5 refills | Status: DC | PRN

## 2023-09-02 MED ORDER — CETIRIZINE HCL 10 MG PO TABS: 10.0000 mg | ORAL_TABLET | Freq: Every day | ORAL | 5 refills | Status: DC

## 2023-09-02 MED ORDER — AZELASTINE HCL 0.1 % NA SOLN: 1.0000 | Freq: Two times a day (BID) | NASAL | 5 refills | Status: DC | PRN

## 2023-09-02 MED ORDER — EPINEPHRINE 0.3 MG/0.3ML IJ SOAJ: 0.3000 mg | INTRAMUSCULAR | 1 refills | Status: DC | PRN

## 2023-09-02 NOTE — Patient Instructions (Addendum)
Allergic Rhinitis: - Positive skin test 12/2022: trees, grasses, weeds, mold, cat, dog  - Use nasal saline rinses before nose sprays such as with Neilmed Sinus Rinse.  Use distilled water.   - Use Flonase 2 sprays each nostril daily. Aim upward and outward. - Use Azelastine 1-2 sprays each nostril twice daily as needed for congestion, drainage, runny nose, sneezing. Aim upward and outward. - Use Zyrtec 10 mg daily.  - For eyes, use Olopatadine or Ketotifen 1 eye drop daily as needed for itchy, watery eyes.  Available over the counter, if not covered by insurance.  - Consider allergy shots as long term control of your symptoms by teaching your immune system to be more tolerant of your allergy triggers.  Please call your insurance company regarding coverage and cost estimate for allergy shots.  If interested in starting, please call us back so we can get the vials mixed and shots started.    Oral Allergy Syndrome- Fresh Fruits: -These symptoms are typically not life-threatening and are because of a cross reaction between a pollen you are allergic to, and to a protein in specific foods (such as fresh fruits, vegetables, and nuts). - If you can eat these things and tolerate the symptoms, it is fine to continue to do so.  If not, you may avoid these fresh fruits and vegetables.   - Heating these foods, buying them canned, and peeling these foods should allow them to be consumed without symptoms or with less symptoms. - Patients typically report itching and/or mild swelling of the mouth and throat immediately following ingestion of certain uncooked fruits (including nuts) or raw vegetables.  - Only a very small number of affected individuals experience systemic allergic reactions, such as anaphylaxis which occurs with true food allergies.   - for SKIN only reaction, okay to take Benadryl 25mg  capsules every 6 hours as needed - for SKIN + ANY additional symptoms, OR IF concern for LIFE THREATENING reaction =  Epipen Autoinjector EpiPen 0.3 mg. - If using Epinephrine autoinjector, call 911 or go to the ER.

## 2023-09-02 NOTE — Progress Notes (Signed)
FOLLOW UP Date of Service/Encounter:  09/02/23   Subjective:  Leslie Ayers (DOB: 1990/10/15) is a 33 y.o. female who [facility]23 for follow up for allergic rhinoconjunctivitis and PFAS.   History obtained from: chart review and patient. Last visit was with me on 02/23/2023 and at the time was doing better with medical management.  On Flonase, Azelastine, Zyrtec, Olopatadine eye drops.  Since last visit, reports allergies have not been controlled.  Having trouble with drainage, runny nose, congestion. Using Flonase, Azelastine and Zyrtec daily with minimal relief.  Also feels muffled sound like there is water in her ears.  She has seen [facility]NT in the past for pulsatile tinnitus; temporal bone CT was negative and US carotid showed no stenosis of ICA.   Still avoiding certain fresh fruits. Denies any systemic reactions. Has an Epipen, has not had to use it.   Past Medical History: Past Medical History:  Diagnosis Date   Allergy 1990/08/21   Penicillin   Anal fissure    Anxiety    Depression    History of echocardiogram    Echo 11/17: EF 60-65, no RWMA   Hx of chlamydia infection    Vaginal Pap smear, abnormal     Objective:  BP 108/82 (BP Location: Left Arm, Patient Position: Sitting, Cuff Size: Normal)   Pulse 78   Temp 99.5 F (37.5 C) (Temporal)   Ht 5\' 6"  (1.676 m)   Wt 204 lb 4.8 oz (92.7 kg)   SpO2 99%   BMI 32.97 kg/m  Body mass index is 32.97 kg/m. Physical Exam: GEN: alert, well developed HEENT: clear conjunctiva, nose with mild inferior turbinate hypertrophy, pink nasal mucosa,+ clear rhinorrhea, no cobblestoning HEART: regular rate and rhythm, no murmur LUNGS: clear to auscultation bilaterally, no coughing, unlabored respiration SKIN: no rashes or lesions  Assessment:   1. Seasonal and perennial allergic rhinitis   2. Pollen-food allergy, subsequent encounter     Plan/Recommendations:  Allergic Rhinitis: -  Uncontrolled despite maximal medical therapy; discussed considering AIT.  - Positive skin test 12/2022: trees, grasses, weeds, mold, cat, dog  - Use nasal saline rinses before nose sprays such as with Neilmed Sinus Rinse.  Use distilled water.   - Use Flonase 2 sprays each nostril daily. Aim upward and outward. - Use Azelastine 1-2 sprays each nostril twice daily as needed for congestion, drainage, runny nose, sneezing. Aim upward and outward. - Use Zyrtec 10 mg daily.  - For eyes, use Olopatadine or Ketotifen 1 eye drop daily as needed for itchy, watery eyes.  Available over the counter, if not covered by insurance.  - Consider allergy shots as long term control of your symptoms by teaching your immune system to be more tolerant of your allergy triggers.  Please call your insurance company regarding coverage and cost estimate for allergy shots.  If interested in starting, please call us back so we can get the vials mixed and shots started.    Oral Allergy Syndrome- Fruits: -These symptoms are typically not life-threatening and are because of a cross reaction between a pollen you are allergic to, and to a protein in specific foods (such as fresh fruits, vegetables, and nuts). - If you can eat these things and tolerate the symptoms, it is fine to continue to do so.  If not, you may avoid these fresh fruits and vegetables.   - Heating these foods, buying them canned, and peeling these foods should allow  them to be consumed without symptoms or with less symptoms. - Patients typically report itching and/or mild swelling of the mouth and throat immediately following ingestion of certain uncooked fruits (including nuts) or raw vegetables.  - Only a very small number of affected individuals experience systemic allergic reactions, such as anaphylaxis which occurs with true food allergies.   - for SKIN only reaction, okay to take Benadryl 25mg  capsules every 6 hours as needed - for SKIN + ANY additional  symptoms, OR IF concern for LIFE THREATENING reaction = Epipen Autoinjector EpiPen 0.3 mg. - If using Epinephrine autoinjector, call 911 or go to the ER.       Return in about 6 months (around 03/01/2024).  Alesia Morin, MD [facility]

## 2023-09-06 ENCOUNTER — Other Ambulatory Visit: Payer: Self-pay | Admitting: Internal Medicine

## 2023-09-06 DIAGNOSIS — J3081 Allergic rhinitis due to animal (cat) (dog) hair and dander: Secondary | ICD-10-CM

## 2023-09-06 DIAGNOSIS — J301 Allergic rhinitis due to pollen: Secondary | ICD-10-CM

## 2023-09-06 DIAGNOSIS — J3089 Other allergic rhinitis: Secondary | ICD-10-CM

## 2023-09-06 NOTE — Progress Notes (Signed)
AIT Rx entered.  Already has Epipen sent.

## 2023-09-08 DIAGNOSIS — J3081 Allergic rhinitis due to animal (cat) (dog) hair and dander: Secondary | ICD-10-CM | POA: Diagnosis not present

## 2023-09-08 NOTE — Progress Notes (Signed)
VIALS EXP 09-07-24

## 2023-09-08 NOTE — Progress Notes (Signed)
Aeroallergen Immunotherapy   Ordering Provider: Alesia Morin   Patient Details  Name: Leslie Ayers  MRN: [id number]  Date of Birth: 10/08/1990   Order 1 of 2   Vial Label: T,G,W,C,D   0.3 ml (Volume)  BAU Concentration -- 7 Grass Mix* 100,000 ([address] [place], [place], [place], [place], [place], Sweet [place], [place])  0.2 ml (Volume)  1:20 Concentration -- [place]  0.3 ml (Volume)  BAU Concentration -- [place] 10,000  0.2 ml (Volume)  1:20 Concentration -- [place]  0.3 ml (Volume)  1:20 Concentration -- Ragweed Mix  0.5 ml (Volume)  1:20 Concentration -- Weed Mix*  0.5 ml (Volume)  1:20 Concentration -- Eastern 10 Tree Mix (also Sweet Gum)  0.2 ml (Volume)  1:20 Concentration -- Walnut, Black Pollen  0.5 ml (Volume)  1:10 Concentration -- Cat Hair  0.5 ml (Volume)  1:10 Concentration -- Dog Epithelia    3.5  ml Extract Subtotal  1.5  ml Diluent  5.0  ml Maintenance Total   Schedule:  C  Silver Vial (1:1,000,000): Schedule C (5 doses)  Blue Vial (1:100,000): Schedule C (5 doses)  Yellow Vial (1:10,000): Schedule C (5 doses)  Green Vial (1:1,000): Schedule C (5 doses)  Red Vial (1:100): Schedule A (10 doses)   Special Instructions: Sign consent

## 2023-09-08 NOTE — Progress Notes (Signed)
Aeroallergen Immunotherapy  Ordering Provider: Alesia Morin  Patient Details Name: Leslie Ayers MRN: [id number] Date of Birth: 31-Oct-1990  Order 2 of 2  Vial Label: molds  0.2 ml (Volume)  1:20 Concentration -- Alternaria alternata 0.2 ml (Volume)  1:20 Concentration -- Cladosporium herbarum 0.2 ml (Volume)  1:10 Concentration -- Aspergillus mix 0.2 ml (Volume)  1:10 Concentration -- Penicillium mix 0.2 ml (Volume)  1:20 Concentration -- Bipolaris sorokiniana 0.2 ml (Volume)  1:10 Concentration -- Fusarium moniliforme 0.2 ml (Volume)  1:40 Concentration -- Aureobasidium pullulans 0.2 ml (Volume)  1:10 Concentration -- Rhizopus oryzae 0.2 ml (Volume)  1:40 Concentration -- Epicoccum nigrum 0.2 ml (Volume)  1:40 Concentration -- Phoma betae   2  ml Extract Subtotal 3  ml Diluent 5.0  ml Maintenance Total  Schedule:  C Silver Vial (1:1,000,000): Schedule C (5 doses) Blue Vial (1:100,000): Schedule C (5 doses) Yellow Vial (1:10,000): Schedule C (5 doses) Green Vial (1:1,000): Schedule C (5 doses) Red Vial (1:100): Schedule A (10 doses)  Special Instructions: sign consent.

## 2023-09-09 DIAGNOSIS — J302 Other seasonal allergic rhinitis: Secondary | ICD-10-CM | POA: Diagnosis not present

## 2023-09-28 ENCOUNTER — Ambulatory Visit: Payer: Medicaid Other

## 2023-09-29 ENCOUNTER — Ambulatory Visit: Payer: Medicaid Other

## 2023-10-01 ENCOUNTER — Ambulatory Visit (INDEPENDENT_AMBULATORY_CARE_PROVIDER_SITE_OTHER): Payer: Medicaid Other

## 2023-10-01 DIAGNOSIS — J309 Allergic rhinitis, unspecified: Secondary | ICD-10-CM | POA: Diagnosis not present

## 2023-10-01 NOTE — Progress Notes (Signed)
 Immunotherapy   Patient Details  Name: Leslie Ayers MRN: [id number] Date of Birth: 11/05/1990  10/01/2023  Christy Sartorius started injections for  T-G-W-C-D and MOLDS exp date 09/07/2024 Following schedule: C  Frequency:1 time per week Epi-Pen:Epi-Pen Available  Consent signed and patient instructions given.Patient waited in office for 30 minutes without any issues.   Deborra Medina 10/01/2023, 4:41 PM

## 2023-10-11 ENCOUNTER — Ambulatory Visit (INDEPENDENT_AMBULATORY_CARE_PROVIDER_SITE_OTHER): Admitting: *Deleted

## 2023-10-11 DIAGNOSIS — J309 Allergic rhinitis, unspecified: Secondary | ICD-10-CM

## 2023-10-19 ENCOUNTER — Ambulatory Visit: Payer: Medicaid Other | Admitting: Family Medicine

## 2023-10-22 ENCOUNTER — Ambulatory Visit (INDEPENDENT_AMBULATORY_CARE_PROVIDER_SITE_OTHER): Payer: Self-pay

## 2023-10-22 DIAGNOSIS — J309 Allergic rhinitis, unspecified: Secondary | ICD-10-CM | POA: Diagnosis not present

## 2023-10-26 ENCOUNTER — Ambulatory Visit: Payer: Medicaid Other | Admitting: Family Medicine

## 2023-10-29 ENCOUNTER — Ambulatory Visit (INDEPENDENT_AMBULATORY_CARE_PROVIDER_SITE_OTHER): Payer: Self-pay

## 2023-10-29 DIAGNOSIS — J309 Allergic rhinitis, unspecified: Secondary | ICD-10-CM

## 2023-11-05 ENCOUNTER — Ambulatory Visit (INDEPENDENT_AMBULATORY_CARE_PROVIDER_SITE_OTHER): Payer: Self-pay

## 2023-11-05 DIAGNOSIS — J309 Allergic rhinitis, unspecified: Secondary | ICD-10-CM

## 2023-11-17 ENCOUNTER — Ambulatory Visit (INDEPENDENT_AMBULATORY_CARE_PROVIDER_SITE_OTHER): Payer: Self-pay

## 2023-11-17 DIAGNOSIS — J309 Allergic rhinitis, unspecified: Secondary | ICD-10-CM

## 2023-12-03 ENCOUNTER — Ambulatory Visit (INDEPENDENT_AMBULATORY_CARE_PROVIDER_SITE_OTHER): Payer: Self-pay

## 2023-12-03 DIAGNOSIS — J309 Allergic rhinitis, unspecified: Secondary | ICD-10-CM | POA: Diagnosis not present

## 2023-12-09 ENCOUNTER — Ambulatory Visit (INDEPENDENT_AMBULATORY_CARE_PROVIDER_SITE_OTHER): Payer: Self-pay

## 2023-12-09 DIAGNOSIS — J309 Allergic rhinitis, unspecified: Secondary | ICD-10-CM

## 2023-12-16 ENCOUNTER — Ambulatory Visit (INDEPENDENT_AMBULATORY_CARE_PROVIDER_SITE_OTHER): Payer: Self-pay

## 2023-12-16 DIAGNOSIS — J309 Allergic rhinitis, unspecified: Secondary | ICD-10-CM | POA: Diagnosis not present

## 2023-12-29 ENCOUNTER — Ambulatory Visit (INDEPENDENT_AMBULATORY_CARE_PROVIDER_SITE_OTHER)

## 2023-12-29 DIAGNOSIS — J309 Allergic rhinitis, unspecified: Secondary | ICD-10-CM | POA: Diagnosis not present

## 2024-01-11 ENCOUNTER — Ambulatory Visit (INDEPENDENT_AMBULATORY_CARE_PROVIDER_SITE_OTHER): Payer: Self-pay

## 2024-01-11 DIAGNOSIS — J309 Allergic rhinitis, unspecified: Secondary | ICD-10-CM | POA: Diagnosis not present

## 2024-01-21 ENCOUNTER — Ambulatory Visit (INDEPENDENT_AMBULATORY_CARE_PROVIDER_SITE_OTHER): Payer: Self-pay

## 2024-01-21 DIAGNOSIS — J309 Allergic rhinitis, unspecified: Secondary | ICD-10-CM

## 2024-01-28 ENCOUNTER — Ambulatory Visit (INDEPENDENT_AMBULATORY_CARE_PROVIDER_SITE_OTHER): Payer: Self-pay

## 2024-01-28 DIAGNOSIS — J309 Allergic rhinitis, unspecified: Secondary | ICD-10-CM

## 2024-02-04 ENCOUNTER — Ambulatory Visit (INDEPENDENT_AMBULATORY_CARE_PROVIDER_SITE_OTHER)

## 2024-02-04 DIAGNOSIS — J309 Allergic rhinitis, unspecified: Secondary | ICD-10-CM

## 2024-02-09 ENCOUNTER — Ambulatory Visit (INDEPENDENT_AMBULATORY_CARE_PROVIDER_SITE_OTHER)

## 2024-02-09 DIAGNOSIS — J309 Allergic rhinitis, unspecified: Secondary | ICD-10-CM

## 2024-02-16 ENCOUNTER — Ambulatory Visit (INDEPENDENT_AMBULATORY_CARE_PROVIDER_SITE_OTHER)

## 2024-02-16 DIAGNOSIS — J309 Allergic rhinitis, unspecified: Secondary | ICD-10-CM | POA: Diagnosis not present

## 2024-02-23 ENCOUNTER — Ambulatory Visit (INDEPENDENT_AMBULATORY_CARE_PROVIDER_SITE_OTHER)

## 2024-02-23 DIAGNOSIS — J309 Allergic rhinitis, unspecified: Secondary | ICD-10-CM | POA: Diagnosis not present

## 2024-03-01 ENCOUNTER — Ambulatory Visit (INDEPENDENT_AMBULATORY_CARE_PROVIDER_SITE_OTHER)

## 2024-03-01 DIAGNOSIS — J309 Allergic rhinitis, unspecified: Secondary | ICD-10-CM | POA: Diagnosis not present

## 2024-03-03 ENCOUNTER — Ambulatory Visit

## 2024-03-03 DIAGNOSIS — J309 Allergic rhinitis, unspecified: Secondary | ICD-10-CM

## 2024-03-08 ENCOUNTER — Ambulatory Visit (INDEPENDENT_AMBULATORY_CARE_PROVIDER_SITE_OTHER)

## 2024-03-08 DIAGNOSIS — J309 Allergic rhinitis, unspecified: Secondary | ICD-10-CM | POA: Diagnosis not present

## 2024-03-15 ENCOUNTER — Ambulatory Visit (INDEPENDENT_AMBULATORY_CARE_PROVIDER_SITE_OTHER)

## 2024-03-15 DIAGNOSIS — J309 Allergic rhinitis, unspecified: Secondary | ICD-10-CM

## 2024-03-22 ENCOUNTER — Ambulatory Visit

## 2024-03-22 DIAGNOSIS — J309 Allergic rhinitis, unspecified: Secondary | ICD-10-CM | POA: Diagnosis not present

## 2024-03-29 ENCOUNTER — Ambulatory Visit (INDEPENDENT_AMBULATORY_CARE_PROVIDER_SITE_OTHER): Payer: Medicaid Other | Admitting: Internal Medicine

## 2024-03-29 ENCOUNTER — Other Ambulatory Visit: Payer: Self-pay

## 2024-03-29 ENCOUNTER — Encounter: Payer: Self-pay | Admitting: Internal Medicine

## 2024-03-29 VITALS — BP 114/82 | HR 58 | Temp 98.3°F | Resp 14 | Ht 66.0 in | Wt 213.3 lb

## 2024-03-29 DIAGNOSIS — J343 Hypertrophy of nasal turbinates: Secondary | ICD-10-CM

## 2024-03-29 DIAGNOSIS — J302 Other seasonal allergic rhinitis: Secondary | ICD-10-CM

## 2024-03-29 DIAGNOSIS — T781XXD Other adverse food reactions, not elsewhere classified, subsequent encounter: Secondary | ICD-10-CM | POA: Diagnosis not present

## 2024-03-29 DIAGNOSIS — J3089 Other allergic rhinitis: Secondary | ICD-10-CM

## 2024-03-29 MED ORDER — EPINEPHRINE 0.3 MG/0.3ML IJ SOAJ: 0.3000 mg | INTRAMUSCULAR | 1 refills | Status: AC | PRN

## 2024-03-29 MED ORDER — CETIRIZINE HCL 10 MG PO TABS: 10.0000 mg | ORAL_TABLET | Freq: Every day | ORAL | 11 refills | Status: AC

## 2024-03-29 MED ORDER — AZELASTINE HCL 0.1 % NA SOLN: 1.0000 | Freq: Two times a day (BID) | NASAL | 11 refills | Status: AC | PRN

## 2024-03-29 MED ORDER — FLUTICASONE PROPIONATE 50 MCG/ACT NA SUSP: 2.0000 | Freq: Every day | NASAL | 11 refills | Status: AC

## 2024-03-29 MED ORDER — OLOPATADINE HCL 0.2 % OP SOLN: 1.0000 [drp] | Freq: Every day | OPHTHALMIC | 11 refills | Status: AC | PRN

## 2024-03-29 NOTE — Progress Notes (Signed)
 FOLLOW UP Date of Service/Encounter:  03/29/24   Subjective:  Leslie Ayers (DOB: 12/02/1990) is a 33 y.o. female who [facility] on 03[facility]29/2024 for follow up for allergic rhinitis and pollen food allergy  syndrome.  History obtained from: chart review and patient. Last visit was with me on September 02, 2023 with uncontrolled allergies despite maximal medical therapy and discussed starting AIT.  Avoiding certain fresh fruits due to PFAS.  Since last visit, reports symptoms of congestion, drainage, runny nose, sneezing are better with allergy  shots but still not completely controlled.  Using Flonase , Zyrtec  and azelastine  daily.  Sometimes also needs her olopatadine  eyedrops.  Still avoiding several fresh fruits and notes mouth itching with them but can eat them baked or processed.    Past Medical History: Past Medical History:  Diagnosis Date   Allergy  1990/12/09   Penicillin   Anal fissure    Anxiety    Depression    History of echocardiogram    Echo 11/17: EF 60-65, no RWMA   Hx of chlamydia infection    Vaginal Pap smear, abnormal     Objective:  BP 114/82 (BP Location: Left Arm, Patient Position: Sitting, Cuff Size: Normal)   Pulse (!) 58   Temp 98.3 F (36.8 C) (Temporal)   Resp 14   Ht 5' 6 (1.676 m)   Wt 213 lb 4.8 oz (96.8 kg)   SpO2 100%   BMI 34.43 kg/m  Body mass index is 34.43 kg/m. Physical Exam: GEN: alert, well developed HEENT: clear conjunctiva, nose with mild inferior turbinate hypertrophy, boggy nasal mucosa, +clear rhinorrhea, + cobblestoning HEART: regular rate and rhythm, no murmur LUNGS: clear to auscultation bilaterally, no coughing, unlabored respiration SKIN: no rashes or lesions  Assessment:   1. Nasal turbinate hypertrophy   2. Pollen-food allergy , subsequent encounter   3. Seasonal and perennial allergic rhinitis     Plan/Recommendations:    Allergic Rhinitis: - Improved but still uncontrolled,  continue AIT.   - Positive skin test 12/2022: trees, grasses, weeds, mold, cat, dog  - Use nasal saline rinses before nose sprays such as with Neilmed Sinus Rinse.  Use distilled water.   - Use Flonase  2 sprays each nostril daily. Aim upward and outward. - Use Azelastine  2 sprays each nostril twice daily as needed for congestion, drainage, runny nose, sneezing. Aim upward and outward. - Use Zyrtec  10 mg daily. Okay to take an extra dose of Zyrtec  if needed.   - For eyes, use Olopatadine  or Ketotifen 1 eye drop daily as needed for itchy, watery eyes.  Available over the counter, if not covered by insurance. For dry eyes, use lubricating eye over the counter.   - Continue AIT.  Keep Epipen . Initiated 09/2023.  Oral Allergy  Syndrome- Fresh Fruits: -These symptoms are typically not life-threatening and are because of a cross reaction between a pollen you are allergic to, and to a protein in specific foods (such as fresh fruits, vegetables, and nuts). - If you can eat these things and tolerate the symptoms, it is fine to continue to do so.  If not, you may avoid these fresh fruits and vegetables.   - Heating these foods, buying them canned, and peeling these foods should allow them to be consumed without symptoms or with less symptoms. - Patients typically report itching and/or mild swelling of the mouth and throat immediately following ingestion of certain uncooked fruits (including nuts) or raw vegetables.  - Only a very  small number of affected individuals experience systemic allergic reactions, such as anaphylaxis which occurs with true food allergies.   - for SKIN only reaction, okay to take Benadryl  25mg  capsules every 6 hours as needed - for SKIN + ANY additional symptoms, OR IF concern for LIFE THREATENING reaction = Epipen  Autoinjector EpiPen  0.3 mg. - If using Epinephrine  autoinjector, call 911 or go to the ER.     Return in about 1 year (around 03/29/2025).  Arleta Blanch, MD [facility] [place] [facility] 

## 2024-03-29 NOTE — Patient Instructions (Addendum)
 Allergic Rhinitis: - Positive skin test 12/2022: trees, grasses, weeds, mold, cat, dog  - Use nasal saline rinses before nose sprays such as with Neilmed Sinus Rinse.  Use distilled water.   - Use Flonase  2 sprays each nostril daily. Aim upward and outward. - Use Azelastine  2 sprays each nostril twice daily as needed for congestion, drainage, runny nose, sneezing. Aim upward and outward. - Use Zyrtec  10 mg daily. Okay to take an extra dose of Zyrtec  if needed.   - For eyes, use Olopatadine  or Ketotifen 1 eye drop daily as needed for itchy, watery eyes.  Available over the counter, if not covered by insurance. For dry eyes, use lubricating eye over the counter.   - Continue AIT.  Keep Epipen . Initiated 09/2023.  Oral Allergy  Syndrome- Fresh Fruits: -These symptoms are typically not life-threatening and are because of a cross reaction between a pollen you are allergic to, and to a protein in specific foods (such as fresh fruits, vegetables, and nuts). - If you can eat these things and tolerate the symptoms, it is fine to continue to do so.  If not, you may avoid these fresh fruits and vegetables.   - Heating these foods, buying them canned, and peeling these foods should allow them to be consumed without symptoms or with less symptoms. - Patients typically report itching and/or mild swelling of the mouth and throat immediately following ingestion of certain uncooked fruits (including nuts) or raw vegetables.  - Only a very small number of affected individuals experience systemic allergic reactions, such as anaphylaxis which occurs with true food allergies.   - for SKIN only reaction, okay to take Benadryl  25mg  capsules every 6 hours as needed - for SKIN + ANY additional symptoms, OR IF concern for LIFE THREATENING reaction = Epipen  Autoinjector EpiPen  0.3 mg. - If using Epinephrine  autoinjector, call 911 or go to the ER.

## 2024-04-05 ENCOUNTER — Ambulatory Visit (INDEPENDENT_AMBULATORY_CARE_PROVIDER_SITE_OTHER)

## 2024-04-05 DIAGNOSIS — J309 Allergic rhinitis, unspecified: Secondary | ICD-10-CM

## 2024-04-11 ENCOUNTER — Ambulatory Visit (INDEPENDENT_AMBULATORY_CARE_PROVIDER_SITE_OTHER)

## 2024-04-11 DIAGNOSIS — J309 Allergic rhinitis, unspecified: Secondary | ICD-10-CM

## 2024-04-19 ENCOUNTER — Ambulatory Visit (INDEPENDENT_AMBULATORY_CARE_PROVIDER_SITE_OTHER)

## 2024-04-19 DIAGNOSIS — J309 Allergic rhinitis, unspecified: Secondary | ICD-10-CM | POA: Diagnosis not present

## 2024-04-26 ENCOUNTER — Ambulatory Visit (INDEPENDENT_AMBULATORY_CARE_PROVIDER_SITE_OTHER)

## 2024-04-26 DIAGNOSIS — J309 Allergic rhinitis, unspecified: Secondary | ICD-10-CM | POA: Diagnosis not present

## 2024-05-03 ENCOUNTER — Ambulatory Visit (INDEPENDENT_AMBULATORY_CARE_PROVIDER_SITE_OTHER)

## 2024-05-03 DIAGNOSIS — J309 Allergic rhinitis, unspecified: Secondary | ICD-10-CM | POA: Diagnosis not present

## 2024-05-10 ENCOUNTER — Ambulatory Visit

## 2024-05-10 DIAGNOSIS — J309 Allergic rhinitis, unspecified: Secondary | ICD-10-CM | POA: Diagnosis not present

## 2024-05-17 ENCOUNTER — Ambulatory Visit (INDEPENDENT_AMBULATORY_CARE_PROVIDER_SITE_OTHER)

## 2024-05-17 DIAGNOSIS — J309 Allergic rhinitis, unspecified: Secondary | ICD-10-CM | POA: Diagnosis not present

## 2024-05-24 ENCOUNTER — Ambulatory Visit

## 2024-05-24 ENCOUNTER — Other Ambulatory Visit: Payer: Self-pay | Admitting: Family Medicine

## 2024-05-24 DIAGNOSIS — F419 Anxiety disorder, unspecified: Secondary | ICD-10-CM

## 2024-05-24 DIAGNOSIS — J309 Allergic rhinitis, unspecified: Secondary | ICD-10-CM

## 2024-05-31 ENCOUNTER — Ambulatory Visit

## 2024-05-31 DIAGNOSIS — J309 Allergic rhinitis, unspecified: Secondary | ICD-10-CM

## 2024-06-06 ENCOUNTER — Ambulatory Visit (INDEPENDENT_AMBULATORY_CARE_PROVIDER_SITE_OTHER)

## 2024-06-06 DIAGNOSIS — J309 Allergic rhinitis, unspecified: Secondary | ICD-10-CM

## 2024-06-21 ENCOUNTER — Ambulatory Visit

## 2024-06-21 DIAGNOSIS — J309 Allergic rhinitis, unspecified: Secondary | ICD-10-CM

## 2024-07-05 ENCOUNTER — Ambulatory Visit

## 2024-07-05 DIAGNOSIS — J309 Allergic rhinitis, unspecified: Secondary | ICD-10-CM | POA: Diagnosis not present

## 2024-07-19 ENCOUNTER — Ambulatory Visit (INDEPENDENT_AMBULATORY_CARE_PROVIDER_SITE_OTHER)

## 2024-07-19 DIAGNOSIS — J309 Allergic rhinitis, unspecified: Secondary | ICD-10-CM

## 2024-07-20 DIAGNOSIS — J301 Allergic rhinitis due to pollen: Secondary | ICD-10-CM | POA: Diagnosis not present

## 2024-07-20 DIAGNOSIS — J3081 Allergic rhinitis due to animal (cat) (dog) hair and dander: Secondary | ICD-10-CM | POA: Diagnosis not present

## 2024-07-20 NOTE — Progress Notes (Signed)
 VIALS MADE ON 07/20/24

## 2024-07-21 DIAGNOSIS — J302 Other seasonal allergic rhinitis: Secondary | ICD-10-CM | POA: Diagnosis not present

## 2024-08-01 ENCOUNTER — Ambulatory Visit

## 2024-08-01 DIAGNOSIS — J309 Allergic rhinitis, unspecified: Secondary | ICD-10-CM | POA: Diagnosis not present

## 2024-08-08 ENCOUNTER — Ambulatory Visit (INDEPENDENT_AMBULATORY_CARE_PROVIDER_SITE_OTHER)

## 2024-08-08 DIAGNOSIS — J309 Allergic rhinitis, unspecified: Secondary | ICD-10-CM | POA: Diagnosis not present

## 2024-08-17 ENCOUNTER — Ambulatory Visit (INDEPENDENT_AMBULATORY_CARE_PROVIDER_SITE_OTHER)

## 2024-08-17 DIAGNOSIS — J302 Other seasonal allergic rhinitis: Secondary | ICD-10-CM

## 2024-09-13 ENCOUNTER — Ambulatory Visit

## 2024-09-13 DIAGNOSIS — J302 Other seasonal allergic rhinitis: Secondary | ICD-10-CM

## 2025-03-30 ENCOUNTER — Ambulatory Visit: Admitting: Internal Medicine
